# Patient Record
Sex: Male | Born: 1960 | Race: White | Hispanic: No | Marital: Married | Smoking: Never smoker
Health system: Southern US, Community
[De-identification: ages and names within clinical notes are randomized; demographics above are authoritative.]

## PROBLEM LIST (undated history)

## (undated) DIAGNOSIS — F329 Major depressive disorder, single episode, unspecified: Secondary | ICD-10-CM

## (undated) DIAGNOSIS — K625 Hemorrhage of anus and rectum: Secondary | ICD-10-CM

## (undated) DIAGNOSIS — E785 Hyperlipidemia, unspecified: Secondary | ICD-10-CM

## (undated) DIAGNOSIS — F32A Depression, unspecified: Secondary | ICD-10-CM

## (undated) DIAGNOSIS — I10 Essential (primary) hypertension: Secondary | ICD-10-CM

## (undated) HISTORY — DX: Depression, unspecified: F32.A

## (undated) HISTORY — DX: Essential (primary) hypertension: I10

## (undated) HISTORY — DX: Hemorrhage of anus and rectum: K62.5

## (undated) HISTORY — PX: ANAL FISSURE REPAIR: SHX2312

## (undated) HISTORY — PX: PROSTATE BIOPSY: SHX241

## (undated) HISTORY — DX: Hyperlipidemia, unspecified: E78.5

## (undated) HISTORY — DX: Major depressive disorder, single episode, unspecified: F32.9

---

## 2007-01-17 ENCOUNTER — Ambulatory Visit: Payer: Self-pay | Admitting: Internal Medicine

## 2007-01-17 LAB — CONVERTED CEMR LAB: TSH: 1.38 microintl units/mL (ref 0.35–5.50)

## 2007-02-14 ENCOUNTER — Ambulatory Visit: Payer: Self-pay | Admitting: Internal Medicine

## 2007-02-14 IMAGING — CR DG CHEST 2V
1 series · 1 of 1 positions shown · non-contrast
Comparison: None available.

CLINICAL DATA: Dry cough.  Physical.  
 CHEST - 2 VIEW - [DATE]:

[view not recorded]
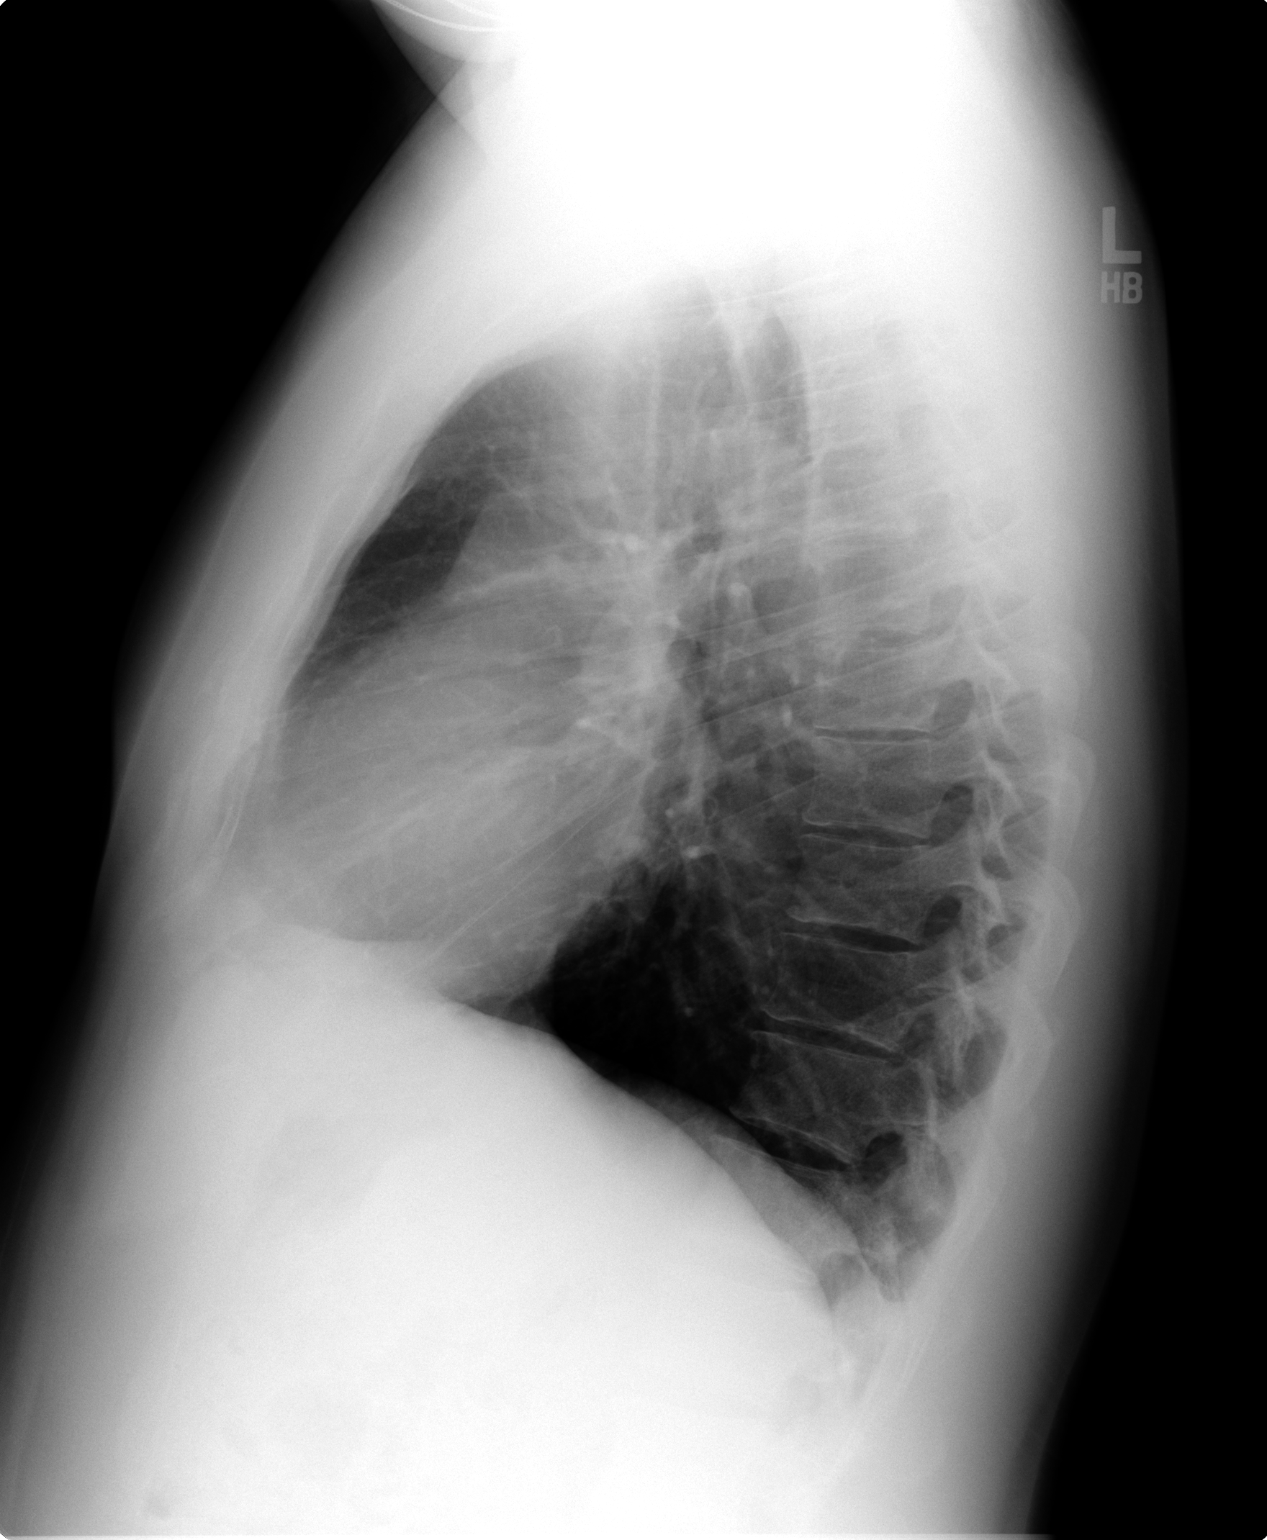

[1 of 1 positions shown; findings below may reference images not displayed]

FINDINGS: The trachea is midline.   The heart size is normal.   The lungs are clear.   No pleural fluid.
IMPRESSION: No acute findings.

## 2007-05-09 ENCOUNTER — Ambulatory Visit: Payer: Self-pay | Admitting: Internal Medicine

## 2007-05-09 LAB — CONVERTED CEMR LAB
ALT: 32 units/L (ref 0–40)
Alkaline Phosphatase: 71 units/L (ref 39–117)
Cholesterol: 183 mg/dL (ref 0–200)
Total Bilirubin: 0.6 mg/dL (ref 0.3–1.2)
Total CHOL/HDL Ratio: 3.8
Total Protein: 7.2 g/dL (ref 6.0–8.3)

## 2007-07-18 ENCOUNTER — Encounter: Payer: Self-pay | Admitting: Internal Medicine

## 2007-07-18 DIAGNOSIS — E785 Hyperlipidemia, unspecified: Secondary | ICD-10-CM

## 2007-07-18 DIAGNOSIS — D179 Benign lipomatous neoplasm, unspecified: Secondary | ICD-10-CM | POA: Insufficient documentation

## 2007-07-18 DIAGNOSIS — F329 Major depressive disorder, single episode, unspecified: Secondary | ICD-10-CM

## 2007-07-18 DIAGNOSIS — F32A Depression, unspecified: Secondary | ICD-10-CM | POA: Insufficient documentation

## 2007-11-19 ENCOUNTER — Encounter: Payer: Self-pay | Admitting: Internal Medicine

## 2008-02-13 ENCOUNTER — Encounter: Payer: Self-pay | Admitting: Internal Medicine

## 2008-05-11 ENCOUNTER — Encounter: Payer: Self-pay | Admitting: Internal Medicine

## 2008-08-04 ENCOUNTER — Ambulatory Visit: Payer: Self-pay | Admitting: Internal Medicine

## 2008-08-04 LAB — CONVERTED CEMR LAB
ALT: 24 units/L (ref 0–53)
AST: 23 units/L (ref 0–37)
Basophils Relative: 0.7 % (ref 0.0–3.0)
Bilirubin, Direct: 0.1 mg/dL (ref 0.0–0.3)
CO2: 29 meq/L (ref 19–32)
Calcium: 9.1 mg/dL (ref 8.4–10.5)
Glucose, Bld: 103 mg/dL — ABNORMAL HIGH (ref 70–99)
Hemoglobin: 14.8 g/dL (ref 13.0–17.0)
Leukocytes, UA: NEGATIVE
Lymphocytes Relative: 26.9 % (ref 12.0–46.0)
Monocytes Relative: 8.5 % (ref 3.0–12.0)
Neutro Abs: 4 10*3/uL (ref 1.4–7.7)
Nitrite: NEGATIVE
RBC: 4.76 M/uL (ref 4.22–5.81)
Sodium: 141 meq/L (ref 135–145)
Specific Gravity, Urine: 1.015 (ref 1.000–1.03)
Total CHOL/HDL Ratio: 2.9
Total Protein: 7.3 g/dL (ref 6.0–8.3)
pH: 7.5 (ref 5.0–8.0)

## 2008-08-09 ENCOUNTER — Ambulatory Visit: Payer: Self-pay | Admitting: Internal Medicine

## 2008-08-24 ENCOUNTER — Ambulatory Visit: Payer: Self-pay | Admitting: Internal Medicine

## 2008-08-24 DIAGNOSIS — J069 Acute upper respiratory infection, unspecified: Secondary | ICD-10-CM | POA: Insufficient documentation

## 2009-04-05 ENCOUNTER — Ambulatory Visit: Payer: Self-pay | Admitting: Internal Medicine

## 2009-04-05 DIAGNOSIS — M545 Low back pain: Secondary | ICD-10-CM | POA: Insufficient documentation

## 2009-04-05 DIAGNOSIS — R972 Elevated prostate specific antigen [PSA]: Secondary | ICD-10-CM | POA: Insufficient documentation

## 2009-04-05 LAB — CONVERTED CEMR LAB
Albumin: 3.9 g/dL (ref 3.5–5.2)
Basophils Absolute: 0 10*3/uL (ref 0.0–0.1)
CO2: 27 meq/L (ref 19–32)
Chloride: 107 meq/L (ref 96–112)
Eosinophils Absolute: 0.1 10*3/uL (ref 0.0–0.7)
GFR calc non Af Amer: 84.92 mL/min (ref >60)
Glucose, Bld: 98 mg/dL (ref 70–99)
HCT: 42.5 % (ref 39.0–52.0)
Lymphs Abs: 1.2 10*3/uL (ref 0.7–4.0)
MCHC: 34.6 g/dL (ref 30.0–36.0)
MCV: 89.1 fL (ref 78.0–100.0)
Monocytes Absolute: 0.4 10*3/uL (ref 0.1–1.0)
Monocytes Relative: 9.1 % (ref 3.0–12.0)
Platelets: 222 10*3/uL (ref 150.0–400.0)
Potassium: 4 meq/L (ref 3.5–5.1)
RDW: 12.9 % (ref 11.5–14.6)
Sed Rate: 7 mm/hr (ref 0–22)
Sodium: 140 meq/L (ref 135–145)
Total Bilirubin: 0.8 mg/dL (ref 0.3–1.2)

## 2009-04-07 LAB — CONVERTED CEMR LAB: Vit D, 25-Hydroxy: 34 ng/mL (ref 30–89)

## 2009-05-06 ENCOUNTER — Encounter: Payer: Self-pay | Admitting: Internal Medicine

## 2009-09-14 ENCOUNTER — Ambulatory Visit: Payer: Self-pay | Admitting: Internal Medicine

## 2009-09-14 DIAGNOSIS — R109 Unspecified abdominal pain: Secondary | ICD-10-CM

## 2009-11-07 ENCOUNTER — Encounter: Payer: Self-pay | Admitting: Internal Medicine

## 2010-06-30 ENCOUNTER — Encounter: Payer: Self-pay | Admitting: Internal Medicine

## 2010-08-24 ENCOUNTER — Ambulatory Visit: Payer: Self-pay | Admitting: Internal Medicine

## 2010-08-25 ENCOUNTER — Ambulatory Visit: Payer: Self-pay | Admitting: Internal Medicine

## 2010-08-30 LAB — CONVERTED CEMR LAB
ALT: 26 units/L (ref 0–53)
BUN: 19 mg/dL (ref 6–23)
Basophils Absolute: 0 10*3/uL (ref 0.0–0.1)
Bilirubin Urine: NEGATIVE
Calcium: 9.7 mg/dL (ref 8.4–10.5)
Chloride: 102 meq/L (ref 96–112)
Cholesterol: 229 mg/dL — ABNORMAL HIGH (ref 0–200)
Creatinine, Ser: 1.2 mg/dL (ref 0.4–1.5)
Eosinophils Absolute: 0.2 10*3/uL (ref 0.0–0.7)
GFR calc non Af Amer: 71.85 mL/min (ref >60)
HCT: 44.1 % (ref 39.0–52.0)
Hemoglobin: 15.4 g/dL (ref 13.0–17.0)
Ketones, ur: NEGATIVE mg/dL
Leukocytes, UA: NEGATIVE
Lymphocytes Relative: 27.1 % (ref 12.0–46.0)
Lymphs Abs: 1.6 10*3/uL (ref 0.7–4.0)
MCHC: 35 g/dL (ref 30.0–36.0)
Neutro Abs: 3.6 10*3/uL (ref 1.4–7.7)
RDW: 13.1 % (ref 11.5–14.6)
Total Bilirubin: 0.8 mg/dL (ref 0.3–1.2)
Total CHOL/HDL Ratio: 4
Triglycerides: 107 mg/dL (ref 0.0–149.0)
Urobilinogen, UA: 0.2 (ref 0.0–1.0)

## 2010-12-19 NOTE — Assessment & Plan Note (Signed)
Summary: per flag/Stacy needs ov/#/cd   Vital Signs:  Patient profile:   50 year old male Height:      64 inches Weight:      182 pounds BMI:     31.35 Temp:     97.3 degrees F oral Pulse rate:   80 / minute Pulse rhythm:   regular Resp:     16 per minute BP sitting:   140 / 90  (left arm) Cuff size:   regular  Vitals Entered By: Lanier Prude, CMA(AAMA) (August 24, 2010 11:06 AM) CC: f/u Is Patient Diabetic? No   CC:  f/u.  History of Present Illness: The patient presents for a follow up of  hyperlipidemia The patient presents for a preventive health examination    Current Medications (verified): 1)  Zoloft 100 Mg  Tabs (Sertraline Hcl) .... Once Daily 2)  Simvastatin 40 Mg  Tabs (Simvastatin) .... Once Daily 3)  Vitamin D3 1000 Unit  Tabs (Cholecalciferol) .[name].. 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         anal mucousy d/c x months, no blood or diarrhea  Physical Exam  General:  Well-developed,well-nourished,in no acute distress; alert,appropriate and cooperative throughout examination Head:  Normocephalic and atraumatic without obvious abnormalities. No apparent alopecia or  balding. Eyes:  No corneal or conjunctival inflammation noted. EOMI. Perrla. Ears:  External ear exam shows no significant lesions or deformities.  Otoscopic examination reveals clear canals, tympanic membranes are intact bilaterally without bulging, retraction, inflammation or discharge. Hearing is grossly normal bilaterally. Nose:  External nasal examination shows no deformity or inflammation. Nasal mucosa are pink and moist without lesions or exudates. Mouth:  Oral mucosa and oropharynx without lesions or exudates.  Teeth in good repair. Neck:  No deformities, masses, or tenderness noted. Lungs:  Normal respiratory effort, chest expands symmetrically. Lungs are clear to auscultation, no crackles or wheezes. Heart:  Normal rate and regular rhythm. S1 and S2 normal without gallop, murmur, click, rub or other extra sounds. Abdomen:  Bowel sounds positive,abdomen soft and non-tender without masses, organomegaly or hernias noted. Rectal:  NE Genitalia:  per Urol Prostate:  per Urol Msk:  No deformity or scoliosis noted of thoracic or lumbar spine.   Pulses:  R and L carotid,radial,femoral,dorsalis pedis and posterior tibial pulses are full and equal bilaterally Extremities:  No clubbing, cyanosis, edema, or deformity noted with normal full range of motion of all joints.   Neurologic:  No cranial nerve deficits noted. Station and gait are normal. Plantar reflexes are down-going bilaterally. DTRs are symmetrical throughout. Sensory, motor and coordinative functions appear intact. Skin:  Intact without suspicious lesions or rashes Cervical Nodes:  No lymphadenopathy noted Psych:  Cognition and judgment appear intact. Alert and cooperative with normal attention span and concentration. No apparent delusions, illusions, hallucinations   Impression & Recommendations:  Problem # 1:  WELL ADULT  EXAM (ICD-V70.0) Assessment New  Health and age related issues were discussed. Available screening tests and  vaccinations were discussed as well. Healthy life style including good diet and exercise was discussed.  Labs ordered  Problem # 2:  DEPRESSION (ICD-311) Assessment: Improved  His updated medication list for this problem includes:    Zoloft 100 Mg Tabs (Sertraline hcl) ..... Once daily  Problem # 3:  HYPERLIPIDEMIA (ICD-272.4) Assessment: Improved  His updated medication list for this problem includes:    Simvastatin 40 Mg Tabs (Simvastatin) ..... Once daily  Complete Medication List: 1)  Zoloft 100 Mg Tabs (Sertraline hcl) .... Once daily 2)  Simvastatin 40 Mg Tabs (Simvastatin) .... Once daily 3)  Vitamin D3 1000 Unit Tabs (Cholecalciferol) .[name].. 1 by mouth daily 4)  Anusol-hc 25 Mg Supp (Hydrocortisone acetate) .[name].. 1 pr two times a day 5)  Anusol-hc 2.5 % Crea (Hydrocortisone) .... Use two times a day prn  Patient Instructions: 1)  Labs tomorrow 2)  BMP prior to visit, ICD-9: 3)  Hepatic Panel prior to visit, ICD-9: v70.0 4)  Lipid Panel prior to visit, ICD-9: 5)  TSH prior to visit, ICD-9: 6)  CBC w/ Diff prior to visit, ICD-9: 7)  Urine-dip prior to visit, ICD-9: 272.0 995.20 8)  Please schedule a follow-up appointment in 1 year. 9)  Nl BP 130/85 or less Prescriptions: SIMVASTATIN 40 MG  TABS (SIMVASTATIN) once daily  #90 Tablet x 3   Entered and Authorized by:   Tresa Garter MD   Signed by:   Tresa Garter MD on 08/24/2010   Method used:   Print then Give to Patient   RxID:   [id number] ANUSOL-HC 2.5 % CREA (HYDROCORTISONE) use two times a day prn  #45 g x 3   Entered and Authorized by:   Tresa Garter MD   Signed by:   Tresa Garter MD on 08/24/2010   Method used:   Print then Give to Patient   RxID:   [id number] ANUSOL-HC 25 MG SUPP (HYDROCORTISONE ACETATE) 1 pr two times a day  #20 x 3   Entered and Authorized by:   Tresa Garter MD   Signed by:   Tresa Garter MD on 08/24/2010   Method used:   Print then Give to  Patient   RxID:   [id number]

## 2010-12-19 NOTE — Letter (Signed)
Summary: Office Visit / [facility]   Office Visit / [facility]   Imported By: Lennie Odor 11/21/2009 14:14:43  _____________________________________________________________________  External Attachment:    Type:   Image     Comment:   External Document

## 2010-12-19 NOTE — Letter (Signed)
Summary: [facility]   Imported By: Sherian Rein 07/25/2010 07:40:02  _____________________________________________________________________  External Attachment:    Type:   Image     Comment:   External Document

## 2011-01-02 ENCOUNTER — Encounter: Payer: Self-pay | Admitting: Internal Medicine

## 2011-01-02 ENCOUNTER — Ambulatory Visit (INDEPENDENT_AMBULATORY_CARE_PROVIDER_SITE_OTHER): Payer: BC Managed Care – PPO | Admitting: Internal Medicine

## 2011-01-02 DIAGNOSIS — I10 Essential (primary) hypertension: Secondary | ICD-10-CM

## 2011-01-02 DIAGNOSIS — R519 Headache, unspecified: Secondary | ICD-10-CM | POA: Insufficient documentation

## 2011-01-02 DIAGNOSIS — R51 Headache: Secondary | ICD-10-CM

## 2011-01-02 DIAGNOSIS — K649 Unspecified hemorrhoids: Secondary | ICD-10-CM

## 2011-01-10 NOTE — Assessment & Plan Note (Signed)
Summary: blood pressure   Vital Signs:  Patient profile:   50 year old male Height:      64 inches Weight:      183 pounds BMI:     31.53 Temp:     98.5 degrees F oral Pulse rate:   76 / minute Pulse rhythm:   regular Resp:     16 per minute BP sitting:   132 / 90  (left arm) Cuff size:   regular  Vitals Entered By: Lanier Prude, CMA(AAMA) (January 02, 2011 11:22 AM) CC: elevated BP, "Heavy head" Is Patient Diabetic? No   CC:  elevated BP and "Heavy head".  History of Present Illness: F/u HTN, HA and stress SBP 150s lately  Current Medications (verified): 1)  Zoloft 100 Mg  Tabs (Sertraline Hcl) .... Once Daily 2)  Simvastatin 40 Mg  Tabs (Simvastatin) .... Once Daily 3)  Vitamin D3 1000 Unit  Tabs (Cholecalciferol) .[name].. 1 By Mouth Daily 4)  Anusol-Hc 2.5 % Crea (Hydrocortisone) .... Use Two Times A Day Prn  Allergies (verified): No Known Drug Allergies  Past History:  Past Medical History: Depression Hyperlipidemia Elev PSA s/p prostate Bx 2009 Hypertension  Physical Exam  General:  Well-developed,well-nourished,in no acute distress; alert,appropriate and cooperative throughout examination   Impression & Recommendations:  Problem # 1:  HYPERTENSION (ICD-401.9)  His updated medication list for this problem includes:    Losartan Potassium 100 Mg Tabs (Losartan potassium) .[name]... 1 by mouth once daily for blood pressure  Problem # 2:  HEADACHE (ICD-784.0) due to #1 Assessment: New  Problem # 3:  HEMORRHOIDS (ICD-455.6)/discharge Assessment: Unchanged  GI consult/colon with Dr Loreta Ave in March.   Orders: Gastroenterology Referral (GI)  Complete Medication List: 1)  Zoloft 100 Mg Tabs (Sertraline hcl) .... Once daily 2)  Simvastatin 40 Mg Tabs (Simvastatin) .... Once daily 3)  Vitamin D3 1000 Unit Tabs (Cholecalciferol) .[name].. 1 by mouth daily 4)  Anusol-hc 2.5 % Crea (Hydrocortisone) .... Use two times a day prn 5)  Losartan Potassium 100 Mg Tabs  (Losartan potassium) .[name].. 1 by mouth once daily for blood pressure  Patient Instructions: 1)  Please schedule a follow-up appointment in 3 months. 2)  Normal BP < 130/85 Prescriptions: ANUSOL-HC 2.5 % CREA (HYDROCORTISONE) use two times a day prn  #45 g x 3   Entered and Authorized by:   Tresa Garter MD   Signed by:   Tresa Garter MD on 01/02/2011   Method used:   Print then Give to Patient   RxID:   [id number] LOSARTAN POTASSIUM 100 MG TABS (LOSARTAN POTASSIUM) 1 by mouth once daily for blood pressure  #30 x 12   Entered and Authorized by:   Tresa Garter MD   Signed by:   Tresa Garter MD on 01/02/2011   Method used:   Print then Give to Patient   RxID:   [id number]    Orders Added: 1)  Gastroenterology Referral [GI]  Appended Document: blood pressure Addendum: PE: HEENT nl; lungs CTA: heart RRR; LE w/o edema. Rectal: NE. Plan: reduce stress, regular exercise, NAS diet.

## 2011-04-06 NOTE — Assessment & Plan Note (Signed)
[facility]                           PRIMARY CARE OFFICE NOTE   NAME:Orozco, Kenneth                           MRN:          [id number]  DATE:01/17/2007                            DOB:          1961/07/29    The patient is a 50 year old male who presents to establish primary.  He  complains of occasional low back pain over the past several years.  This  happens on rare occasions, last time last month.  The pain lasts for 3-4  days, goes down to the buttock and makes him quite incapacitated for a  few days.  He is also concerned about elevated cholesterol, depressive  symptoms, and weight gain over the past several months.   PAST MEDICAL HISTORY:  1. Dyslipidemia.  2. Depression monitored by Dr. Nolen Mu.  3. 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 Retta Diones.   ALLERGIES:  None.   MEDICATIONS:  1. Zoloft 20 mg daily.  2. Simvastatin 40 mg daily.   FAMILY HISTORY:  Father with prostate cancer at age 34.  Mother with  elevated cholesterol.  Grandfather with lung cancer.   SOCIAL HISTORY:  He works for [employer].  He is married with 2 kids.  Nonsmoker.   REVIEW OF SYSTEMS:  No chest pain or shortness of breath.  Doing well.  The rest of 18-point Review of Systems is negative.   PHYSICAL EXAMINATION:  VITAL SIGNS:  Blood pressure 129/82, pulse 64,  temperature 98.7.  GENERAL:  Looks well.  HEENT:  Moist mucosa.  NECK: Supple.  Thyroid is not enlarged.  LUNGS:  Clear, no wheezes.  CARDIAC: S1, S2.  No murmurs, rubs, or gallops.  ABDOMEN: Soft, nontender without organomegaly or mass felt.  EXTREMITIES:  Lower extremities without edema.  NEUROLOGIC:  Alert and oriented, appropriate.  Denies being depressed.  SKIN:  Clear. Groin not examined.  MUSCULOSKELETAL:  LS without deformities.  Fairly good range of motion.   LABORATORY DATA:  None available.   ASSESSMENT AND PLAN:  1. Weight gain, likely dietary related.  Reduce portions. Obtain TSH       level.  2. Dyslipidemia.  He is on Lipitor.  Fasting lipid profile was done      fairly recently by Dr. Arlyce Dice at [facility].  3. Depressive symptoms.  He has been seeing Dr. Nolen Mu.  He will      continue with Zoloft.  4. Low back pain, occasional, likely musculoskeletal strain.  No signs      of radiculopathy.  If recurs, he may consider seeing Dr. Artist Pais.      Obtain sed rate, obtain LS spine x-rays.  5. Lipoma, left groin.  This is being monitored by Dr. Retta Diones.     Georgina Quint. Plotnikov, MD  Electronically Signed    AVP/MedQ  DD: 01/20/2007  DT: 01/20/2007  Job #: [id number]   cc:   Bertram Millard. Dahlstedt, M.D.

## 2011-07-30 ENCOUNTER — Encounter (INDEPENDENT_AMBULATORY_CARE_PROVIDER_SITE_OTHER): Payer: Self-pay | Admitting: General Surgery

## 2011-07-30 ENCOUNTER — Ambulatory Visit (INDEPENDENT_AMBULATORY_CARE_PROVIDER_SITE_OTHER): Payer: BC Managed Care – PPO | Admitting: General Surgery

## 2011-07-30 VITALS — BP 118/86 | HR 62 | Temp 97.9°F | Ht 65.0 in | Wt 180.0 lb

## 2011-07-30 DIAGNOSIS — L290 Pruritus ani: Secondary | ICD-10-CM

## 2011-07-30 NOTE — Patient Instructions (Signed)
You may have pruritus ani.  Use very soft toilet paper [employer] or [employer] brand).  Apply Desitin to area twice a day.

## 2011-07-30 NOTE — Progress Notes (Signed)
Chief Complaint  Patient presents with  . Other    new pt- eval of rectal discharge     HPI Kenneth Orozco is a 50 y.o. male.   HPI  He is referred by Dr. Loreta Ave for anal discharge and itching.  This has been occurring for about 5 years.  It causes severe pruritus as well.  It does not appear to be related to any specific food or drink.  He gets relief with steroid suppositories.  One suppository can give him relief of his symptoms for 3 days.  It is associated with intermittent rectal bleeding as well.  He underwent a colonoscopy on 06/29/11 which demonstrated small internal hemorrhoids and scattered diverticulosis.  He is here for further evaluation and treatment.  He does cleanse himself vigorously after BMs with dry toilet paper.  Past Medical History  Diagnosis Date  . Hyperlipidemia   . Rectal bleeding   . Hypertension   . Depression     Past Surgical History  Procedure Date  . Anal fissure repair     History reviewed. No pertinent family history.  Social History History  Substance Use Topics  . Smoking status: Never Smoker   . Smokeless tobacco: Not on file  . Alcohol Use: No    No Known Allergies  Current Outpatient Prescriptions  Medication Sig Dispense Refill  . Cholecalciferol (VITAMIN D PO) Take by mouth daily.        . sertraline (ZOLOFT) 100 MG tablet Daily.      . simvastatin (ZOCOR) 40 MG tablet Daily.        Review of Systems Review of Systems  Constitutional: Negative.   Respiratory: Negative.   Cardiovascular: Negative.   Gastrointestinal: Negative for abdominal pain and abdominal distention.    Blood pressure 118/86, pulse 62, temperature 97.9 F (36.6 C), height 5\' 5"  (1.651 m), weight 180 lb (81.647 kg).  Physical Exam Physical Exam  Constitutional: He appears well-developed and well-nourished. No distress.  Genitourinary:       Perianal excoriation with dry red rash and intermittent white plaques extending posteriorly to the top of the gluteal  cleft.  No fissures.  No anal masses.  Anoscopy- small right internal hemorrhoids. No other lesions.  Skin: Rash noted.       White scaly rash on left elbow.    Data Reviewed Dr. Kenna Gilbert notes.  Assessment    Pruritus ani with significant perianal rash.  Etiology could be due to overvigorous anal cleansing or possibly psoriasis.    Plan    Use soft tissue paper and warm water for cleansing.  Apply Desitin to area.  Return visit in 4 weeks.         Zula Hovsepian J 07/30/2011, 9:37 PM

## 2011-08-14 ENCOUNTER — Other Ambulatory Visit: Payer: Self-pay | Admitting: *Deleted

## 2011-08-14 MED ORDER — SIMVASTATIN 40 MG PO TABS: 40.0000 mg | ORAL_TABLET | Freq: Every day | ORAL | Status: DC

## 2011-08-14 NOTE — Progress Notes (Signed)
Pt advised he needs OV. Transferred to scheduler to make f/u.

## 2011-08-21 ENCOUNTER — Telehealth: Payer: Self-pay | Admitting: *Deleted

## 2011-08-21 DIAGNOSIS — E785 Hyperlipidemia, unspecified: Secondary | ICD-10-CM

## 2011-08-21 DIAGNOSIS — I10 Essential (primary) hypertension: Secondary | ICD-10-CM

## 2011-08-21 NOTE — Telephone Encounter (Signed)
Message copied by Merrilyn Puma on Tue Aug 21, 2011  3:20 PM ------      Message from: Newell Coral      Created: Tue Aug 14, 2011  4:42 PM      Regarding: pt wanting labs       This pt is scheduled for a follow up in November.  He stated he wants to get labs done for this follow up appt.  He was hoping to get these done early morning around the time of his appt (2:45)             Thanks so much!!

## 2011-08-21 NOTE — Telephone Encounter (Signed)
What labs are needed for upcoming appt?

## 2011-08-21 NOTE — Telephone Encounter (Signed)
If it is a well exam: TSH, CBC, CMET, UA, lipids Thx

## 2011-08-22 NOTE — Telephone Encounter (Signed)
Not a CPE. Last TSH was normal. Ok for CBC, CMET, lipids?

## 2011-08-24 NOTE — Telephone Encounter (Signed)
Labs entered.

## 2011-08-24 NOTE — Telephone Encounter (Signed)
Addended by: Vernie Murders on: 08/24/2011 07:25 PM   Modules accepted: Orders

## 2011-08-24 NOTE — Telephone Encounter (Signed)
OK thx  

## 2011-08-27 ENCOUNTER — Other Ambulatory Visit: Payer: Self-pay | Admitting: Internal Medicine

## 2011-10-15 ENCOUNTER — Encounter: Payer: Self-pay | Admitting: Internal Medicine

## 2011-10-15 ENCOUNTER — Ambulatory Visit (INDEPENDENT_AMBULATORY_CARE_PROVIDER_SITE_OTHER): Payer: BC Managed Care – PPO | Admitting: Internal Medicine

## 2011-10-15 ENCOUNTER — Ambulatory Visit (INDEPENDENT_AMBULATORY_CARE_PROVIDER_SITE_OTHER)
Admission: RE | Admit: 2011-10-15 | Discharge: 2011-10-15 | Disposition: A | Discharge status: Home / Self Care | Payer: BC Managed Care – PPO | Source: Ambulatory Visit | Attending: Internal Medicine | Admitting: Internal Medicine

## 2011-10-15 DIAGNOSIS — M545 Low back pain, unspecified: Secondary | ICD-10-CM

## 2011-10-15 DIAGNOSIS — F3289 Other specified depressive episodes: Secondary | ICD-10-CM

## 2011-10-15 DIAGNOSIS — G8929 Other chronic pain: Secondary | ICD-10-CM

## 2011-10-15 DIAGNOSIS — I10 Essential (primary) hypertension: Secondary | ICD-10-CM

## 2011-10-15 DIAGNOSIS — F329 Major depressive disorder, single episode, unspecified: Secondary | ICD-10-CM

## 2011-10-15 IMAGING — CR DG LUMBAR SPINE 2-3V
3 series · 3 of 3 positions shown · non-contrast
Comparison: None.

CLINICAL DATA: Low back pain, recently lifted heavy couch

LUMBAR SPINE - 2-3 VIEW

[view not recorded (1 of 3)]
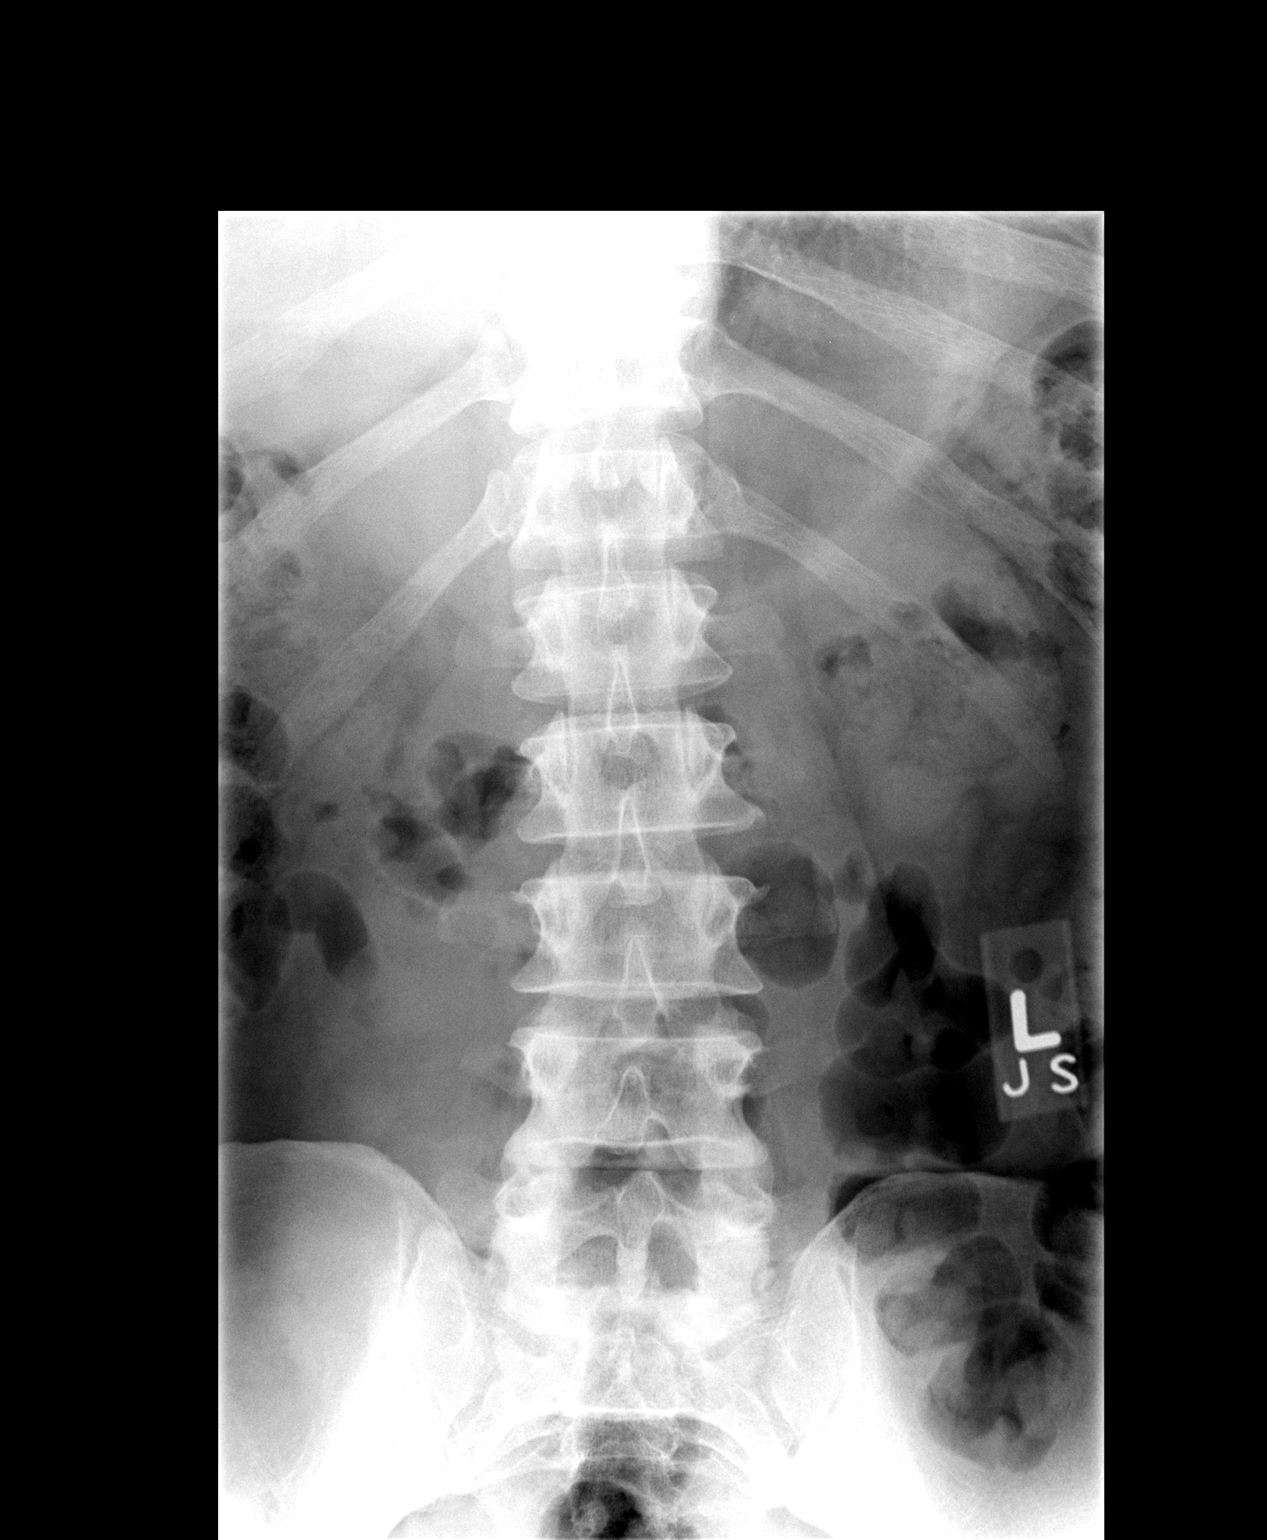

[view not recorded (2 of 3)]
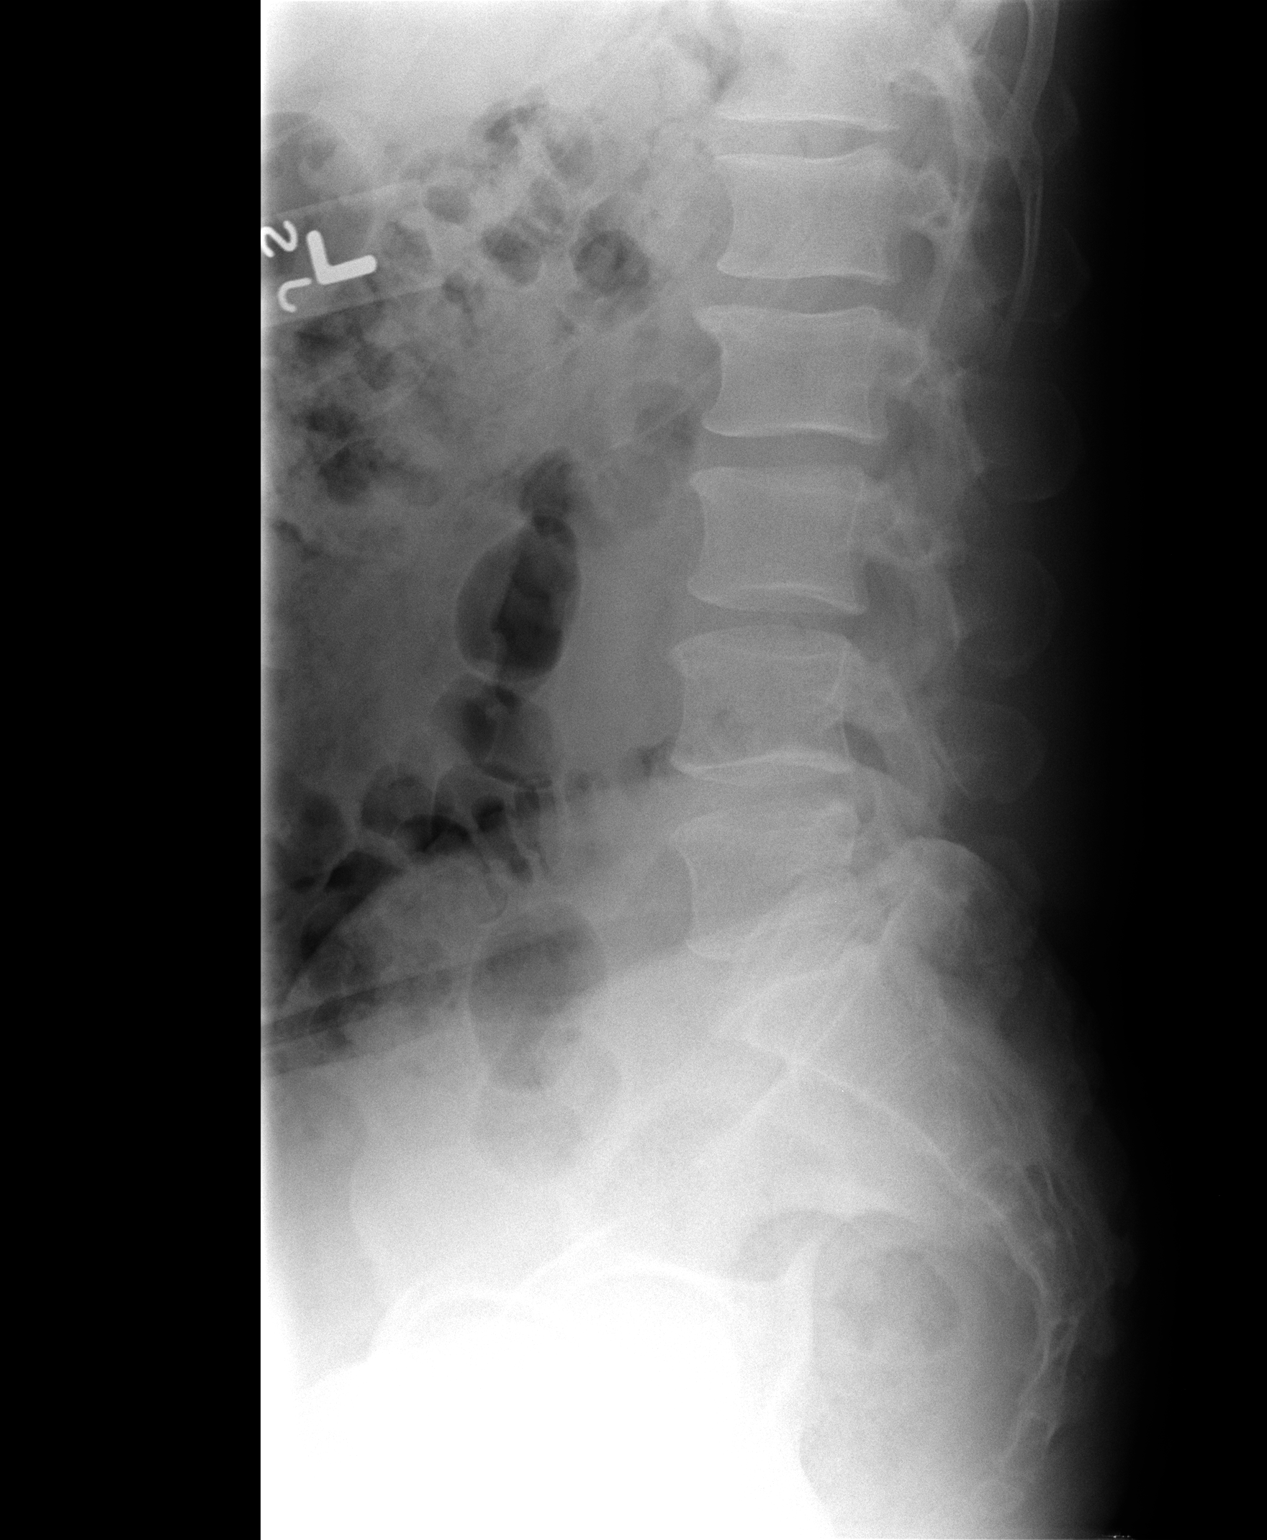

[view not recorded (3 of 3)]
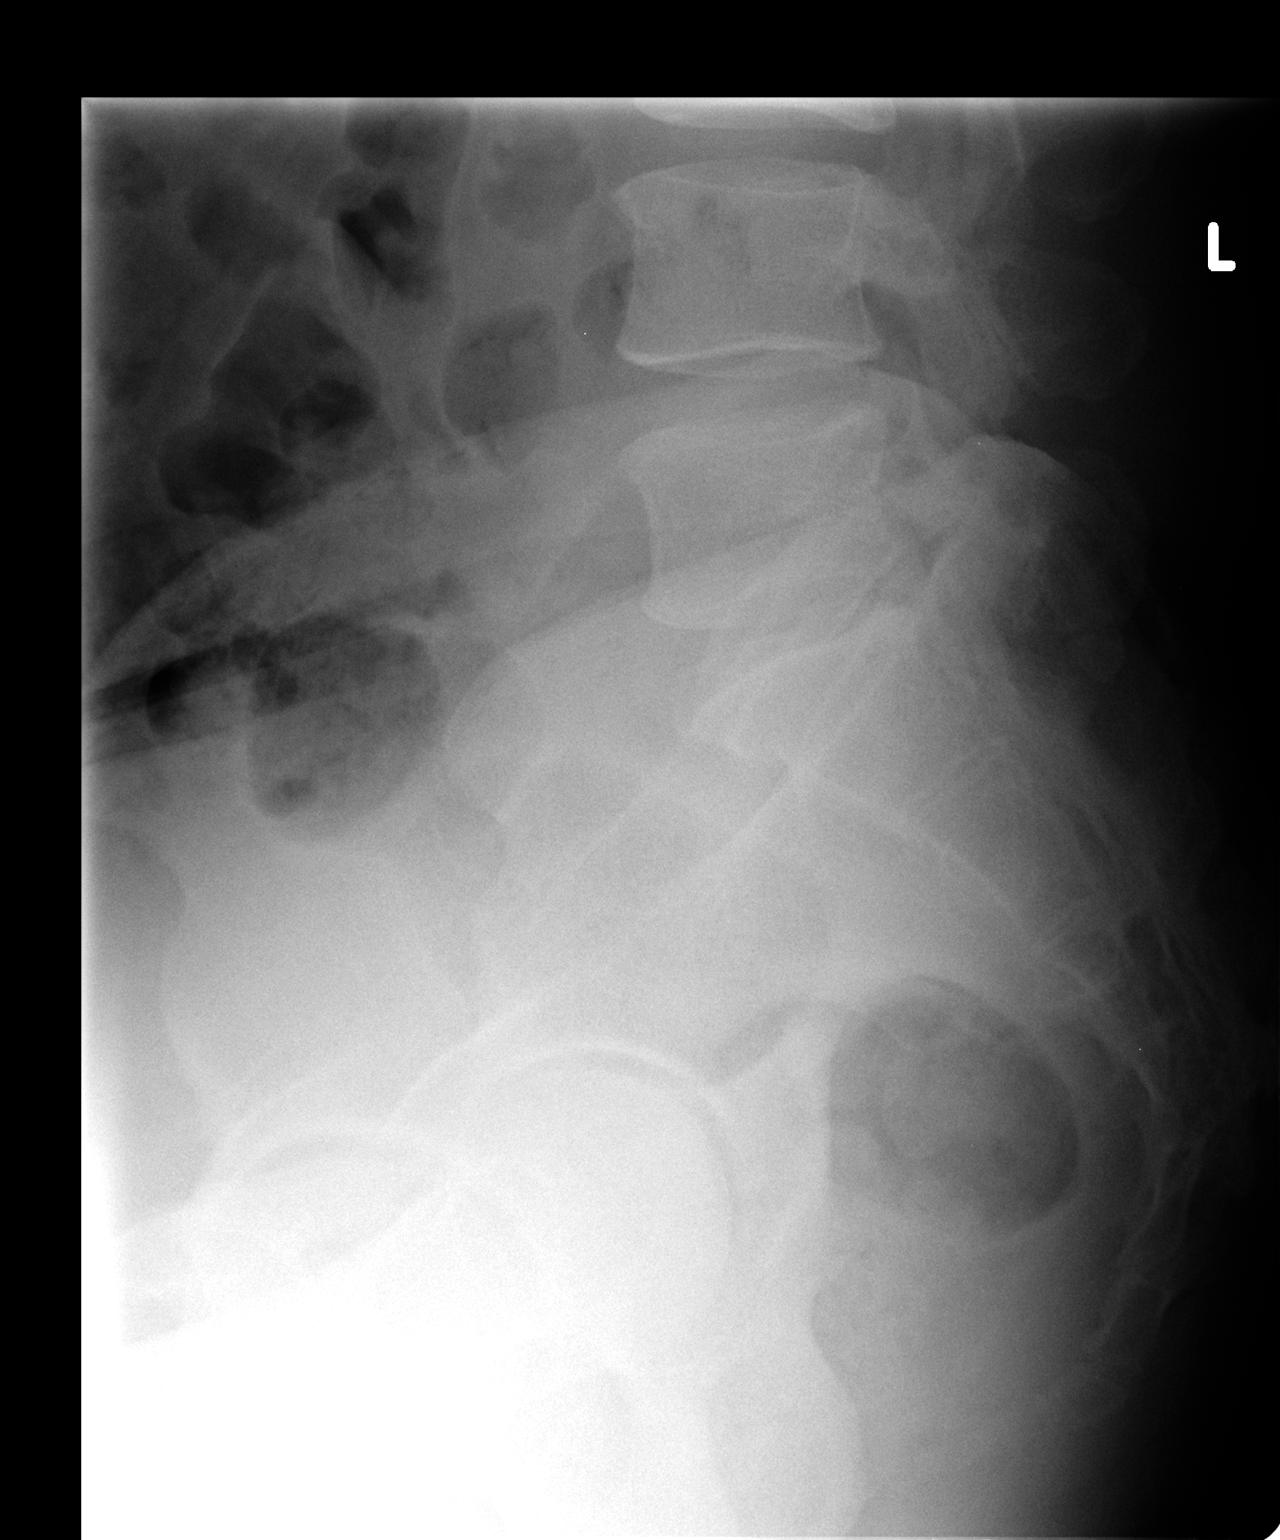

[3 of 3 positions shown; findings below may reference images not displayed]

FINDINGS: The lumbar vertebrae are straightened in alignment.
Intervertebral disc spaces appear normal.  No compression deformity
is seen.  The SI joints appear normal.
IMPRESSION: Straightened alignment.  Normal intervertebral disc spaces.

## 2011-10-15 MED ORDER — SIMVASTATIN 40 MG PO TABS: 40.0000 mg | ORAL_TABLET | Freq: Every day | ORAL | Status: DC

## 2011-10-15 MED ORDER — VALSARTAN 160 MG PO TABS: 160.0000 mg | ORAL_TABLET | Freq: Every day | ORAL | Status: DC

## 2011-10-15 NOTE — Assessment & Plan Note (Signed)
Continue with current prescription therapy as reflected on the Med list.  

## 2011-10-15 NOTE — Patient Instructions (Signed)
BP Readings from Last 3 Encounters:  10/15/11 150/90  07/30/11 118/86  01/02/11 132/90   Wt Readings from Last 3 Encounters:  10/15/11 183 lb (83.008 kg)  07/30/11 180 lb (81.647 kg)  01/02/11 183 lb (83.008 kg)   Normal BP<130/85

## 2011-10-15 NOTE — Assessment & Plan Note (Signed)
MSK  

## 2011-10-15 NOTE — Assessment & Plan Note (Signed)
Diovan if BP is up

## 2011-10-16 ENCOUNTER — Telehealth: Payer: Self-pay | Admitting: Internal Medicine

## 2011-10-16 NOTE — Telephone Encounter (Signed)
Pt informed/ copies mailed.  

## 2011-10-16 NOTE — Telephone Encounter (Signed)
Kenneth Orozco, please, inform patient that LS xray findings are consistent w/muscle spasm/strain  Please, mail the labs to the patient.    Thx

## 2011-10-17 ENCOUNTER — Encounter: Payer: Self-pay | Admitting: Internal Medicine

## 2011-10-17 NOTE — Progress Notes (Signed)
  Subjective:    [name], male    DOB: 10/12/1961, 50 y.o.   MRN: [id number]  HPI  The patient presents for a follow-up of  chronic depression, chronic dyslipidemia,controlled with medicines. C/o LBP that started a couple wks ago after he lifted a couch    Review of Systems  Constitutional: Negative for appetite change, fatigue and unexpected weight change.  HENT: Negative for nosebleeds, congestion, sore throat, sneezing, trouble swallowing and neck pain.   Eyes: Negative for itching and visual disturbance.  Respiratory: Negative for cough.   Cardiovascular: Negative for chest pain, palpitations and leg swelling.  Gastrointestinal: Negative for nausea, diarrhea, blood in stool and abdominal distention.  Genitourinary: Negative for frequency and hematuria.  Musculoskeletal: Positive for back pain. Negative for joint swelling and gait problem.  Skin: Negative for rash.  Neurological: Negative for dizziness, tremors, speech difficulty and weakness.  Psychiatric/Behavioral: Positive for sleep disturbance. Negative for suicidal ideas, dysphoric mood and agitation. The patient is not nervous/anxious.        Objective:   Physical Exam  Constitutional: He is oriented to person, place, and time. He appears well-developed.  HENT:  Mouth/Throat: Oropharynx is clear and moist.  Eyes: Conjunctivae are normal. Pupils are equal, round, and reactive to light.  Neck: Normal range of motion. No JVD present. No thyromegaly present.  Cardiovascular: Normal rate, regular rhythm, normal heart sounds and intact distal pulses.  Exam reveals no gallop and no friction rub.   No murmur heard. Pulmonary/Chest: Effort normal and breath sounds normal. No respiratory distress. He has no wheezes. He has no rales. He exhibits no tenderness.  Abdominal: Soft. Bowel sounds are normal. He exhibits no distension and no mass. There is no tenderness. There is no rebound and no guarding.  Musculoskeletal:  Normal range of motion. He exhibits no edema and no tenderness.       LS NT  Lymphadenopathy:    He has no cervical adenopathy.  Neurological: He is alert and oriented to person, place, and time. He has normal reflexes. No cranial nerve deficit. He exhibits normal muscle tone. Coordination normal.  Skin: Skin is warm and dry. No rash noted.  Psychiatric: He has a normal mood and affect. His behavior is normal. Judgment and thought content normal.          Assessment & Plan:

## 2011-11-09 ENCOUNTER — Other Ambulatory Visit (INDEPENDENT_AMBULATORY_CARE_PROVIDER_SITE_OTHER): Payer: BC Managed Care – PPO

## 2011-11-09 DIAGNOSIS — I10 Essential (primary) hypertension: Secondary | ICD-10-CM

## 2011-11-09 DIAGNOSIS — E785 Hyperlipidemia, unspecified: Secondary | ICD-10-CM

## 2011-11-09 LAB — LIPID PANEL
Cholesterol: 198 mg/dL (ref 0–200)
LDL Cholesterol: 108 mg/dL — ABNORMAL HIGH (ref 0–99)
Total CHOL/HDL Ratio: 4
Triglycerides: 176 mg/dL — ABNORMAL HIGH (ref 0.0–149.0)

## 2011-11-09 LAB — CBC WITH DIFFERENTIAL/PLATELET
Basophils Relative: 0.4 % (ref 0.0–3.0)
Eosinophils Relative: 3.4 % (ref 0.0–5.0)
Hemoglobin: 15.4 g/dL (ref 13.0–17.0)
Lymphocytes Relative: 28 % (ref 12.0–46.0)
Monocytes Relative: 10.4 % (ref 3.0–12.0)
Neutro Abs: 3.5 10*3/uL (ref 1.4–7.7)
Neutrophils Relative %: 57.8 % (ref 43.0–77.0)
RBC: 5.04 Mil/uL (ref 4.22–5.81)
WBC: 6.1 10*3/uL (ref 4.5–10.5)

## 2011-11-09 LAB — HEPATIC FUNCTION PANEL
ALT: 21 U/L (ref 0–53)
AST: 20 U/L (ref 0–37)
Bilirubin, Direct: 0.1 mg/dL (ref 0.0–0.3)
Total Bilirubin: 0.6 mg/dL (ref 0.3–1.2)
Total Protein: 7.1 g/dL (ref 6.0–8.3)

## 2011-11-09 LAB — BASIC METABOLIC PANEL
BUN: 16 mg/dL (ref 6–23)
CO2: 30 mEq/L (ref 19–32)
Chloride: 104 mEq/L (ref 96–112)
Creatinine, Ser: 1.1 mg/dL (ref 0.4–1.5)
Potassium: 4 mEq/L (ref 3.5–5.1)

## 2012-04-15 ENCOUNTER — Encounter: Payer: BC Managed Care – PPO | Admitting: Internal Medicine

## 2012-05-02 ENCOUNTER — Encounter: Payer: Self-pay | Admitting: Internal Medicine

## 2012-05-02 ENCOUNTER — Ambulatory Visit (INDEPENDENT_AMBULATORY_CARE_PROVIDER_SITE_OTHER): Payer: BC Managed Care – PPO | Admitting: Internal Medicine

## 2012-05-02 VITALS — BP 136/94 | HR 80 | Temp 98.4°F | Resp 16 | Ht 65.0 in | Wt 182.0 lb

## 2012-05-02 DIAGNOSIS — Z0000 Encounter for general adult medical examination without abnormal findings: Secondary | ICD-10-CM

## 2012-05-02 DIAGNOSIS — I10 Essential (primary) hypertension: Secondary | ICD-10-CM

## 2012-05-02 DIAGNOSIS — F329 Major depressive disorder, single episode, unspecified: Secondary | ICD-10-CM

## 2012-05-02 DIAGNOSIS — Z23 Encounter for immunization: Secondary | ICD-10-CM

## 2012-05-02 DIAGNOSIS — F3289 Other specified depressive episodes: Secondary | ICD-10-CM

## 2012-05-02 DIAGNOSIS — E785 Hyperlipidemia, unspecified: Secondary | ICD-10-CM

## 2012-05-02 MED ORDER — SERTRALINE HCL 100 MG PO TABS: 150.0000 mg | ORAL_TABLET | Freq: Every day | ORAL | Status: DC

## 2012-05-02 MED ORDER — SIMVASTATIN 40 MG PO TABS: 40.0000 mg | ORAL_TABLET | Freq: Every day | ORAL | Status: DC

## 2012-05-02 MED ORDER — VALSARTAN 160 MG PO TABS: 160.0000 mg | ORAL_TABLET | Freq: Every day | ORAL | Status: DC

## 2012-05-02 NOTE — Patient Instructions (Signed)
Wt Readings from Last 3 Encounters:  05/02/12 182 lb (82.555 kg)  10/15/11 183 lb (83.008 kg)  07/30/11 180 lb (81.647 kg)   BP Readings from Last 3 Encounters:  05/02/12 136/94  10/15/11 150/90  07/30/11 118/86    Normal BP<130/85

## 2012-05-02 NOTE — Assessment & Plan Note (Signed)
Start Valsartan Labs

## 2012-05-02 NOTE — Assessment & Plan Note (Signed)
Labs  Continue with current prescription therapy as reflected on the Med list.  

## 2012-05-02 NOTE — Progress Notes (Signed)
[name], male   DOB: 1961-02-13, 51 y.o.   MRN: [id number]  Subjective:    [name], male    DOB: 10-20-1961, 51 y.o.   MRN: [id number]  HPI The patient is here for a wellness exam. The patient has been doing well overall without major physical or psychological issues going on lately. The patient presents for a follow-up of  chronic depression, chronic dyslipidemia,controlled with medicines. BP Readings from Last 3 Encounters:  05/02/12 136/94  10/15/11 150/90  07/30/11 118/86   Wt Readings from Last 3 Encounters:  05/02/12 182 lb (82.555 kg)  10/15/11 183 lb (83.008 kg)  07/30/11 180 lb (81.647 kg)      Review of Systems  Constitutional: Negative for appetite change, fatigue and unexpected weight change.  HENT: Negative for nosebleeds, congestion, sore throat, sneezing, trouble swallowing and neck pain.   Eyes: Negative for itching and visual disturbance.  Respiratory: Negative for cough.   Cardiovascular: Negative for chest pain, palpitations and leg swelling.  Gastrointestinal: Negative for nausea, diarrhea, blood in stool and abdominal distention.  Genitourinary: Negative for frequency and hematuria.  Musculoskeletal: Positive for back pain. Negative for joint swelling and gait problem.  Skin: Negative for rash.  Neurological: Negative for dizziness, tremors, speech difficulty and weakness.  Psychiatric/Behavioral: Positive for disturbed wake/sleep cycle. Negative for suicidal ideas, dysphoric mood and agitation. The patient is not nervous/anxious.        Objective:   Physical Exam  Constitutional: He is oriented to person, place, and time. He appears well-developed.  HENT:  Mouth/Throat: Oropharynx is clear and moist.  Eyes: Conjunctivae are normal. Pupils are equal, round, and reactive to light.  Neck: Normal range of motion. No JVD present. No thyromegaly present.  Cardiovascular: Normal rate, regular rhythm, normal heart sounds and intact  distal pulses.  Exam reveals no gallop and no friction rub.   No murmur heard. Pulmonary/Chest: Effort normal and breath sounds normal. No respiratory distress. He has no wheezes. He has no rales. He exhibits no tenderness.  Abdominal: Soft. Bowel sounds are normal. He exhibits no distension and no mass. There is no tenderness. There is no rebound and no guarding.  Genitourinary:       Per GI/Urol - recent  Musculoskeletal: Normal range of motion. He exhibits no edema and no tenderness.       LS NT  Lymphadenopathy:    He has no cervical adenopathy.  Neurological: He is alert and oriented to person, place, and time. He has normal reflexes. No cranial nerve deficit. He exhibits normal muscle tone. Coordination normal.  Skin: Skin is warm and dry. No rash noted.  Psychiatric: He has a normal mood and affect. His behavior is normal. Judgment and thought content normal.   Lab Results  Component Value Date   WBC 6.1 11/09/2011   HGB 15.4 11/09/2011   HCT 44.7 11/09/2011   PLT 217.0 11/09/2011   GLUCOSE 89 11/09/2011   CHOL 198 11/09/2011   TRIG 176.0* 11/09/2011   HDL 54.80 11/09/2011   LDLDIRECT 148.1 08/25/2010   LDLCALC 108* 11/09/2011   ALT 21 11/09/2011   AST 20 11/09/2011   NA 141 11/09/2011   K 4.0 11/09/2011   CL 104 11/09/2011   CREATININE 1.1 11/09/2011   BUN 16 11/09/2011   CO2 30 11/09/2011   TSH 1.18 08/25/2010   PSA 2.61 08/25/2010          Assessment & Plan:

## 2012-05-07 DIAGNOSIS — Z0000 Encounter for general adult medical examination without abnormal findings: Secondary | ICD-10-CM | POA: Insufficient documentation

## 2012-05-07 NOTE — Assessment & Plan Note (Signed)
Continue with current prescription therapy as reflected on the Med list.  

## 2012-05-07 NOTE — Assessment & Plan Note (Signed)
We discussed age appropriate health related issues, including available/recomended screening tests and vaccinations. We discussed a need for adhering to healthy diet and exercise. Labs were reviewed/ordered. All questions were answered.   

## 2012-08-01 ENCOUNTER — Other Ambulatory Visit (INDEPENDENT_AMBULATORY_CARE_PROVIDER_SITE_OTHER): Payer: BC Managed Care – PPO

## 2012-08-01 ENCOUNTER — Telehealth: Payer: Self-pay | Admitting: Internal Medicine

## 2012-08-01 DIAGNOSIS — E785 Hyperlipidemia, unspecified: Secondary | ICD-10-CM

## 2012-08-01 DIAGNOSIS — I10 Essential (primary) hypertension: Secondary | ICD-10-CM

## 2012-08-01 LAB — HEPATIC FUNCTION PANEL
AST: 24 U/L (ref 0–37)
Alkaline Phosphatase: 63 U/L (ref 39–117)
Bilirubin, Direct: 0 mg/dL (ref 0.0–0.3)
Total Bilirubin: 0.4 mg/dL (ref 0.3–1.2)

## 2012-08-01 LAB — BASIC METABOLIC PANEL
BUN: 20 mg/dL (ref 6–23)
CO2: 29 mEq/L (ref 19–32)
Chloride: 102 mEq/L (ref 96–112)
Creatinine, Ser: 1.1 mg/dL (ref 0.4–1.5)
Glucose, Bld: 98 mg/dL (ref 70–99)

## 2012-08-01 LAB — LIPID PANEL
Cholesterol: 221 mg/dL — ABNORMAL HIGH (ref 0–200)
Total CHOL/HDL Ratio: 4
VLDL: 19.2 mg/dL (ref 0.0–40.0)

## 2012-08-01 NOTE — Telephone Encounter (Signed)
Kenneth Orozco, please, inform patient that all labs are normal except for a little elev chol of 221  Please, mail the labs to the patient.    Thx

## 2012-08-04 NOTE — Telephone Encounter (Signed)
Pt informed/ copies mailed.  

## 2012-10-07 ENCOUNTER — Ambulatory Visit (INDEPENDENT_AMBULATORY_CARE_PROVIDER_SITE_OTHER): Payer: BC Managed Care – PPO | Admitting: Internal Medicine

## 2012-10-07 ENCOUNTER — Encounter: Payer: Self-pay | Admitting: Internal Medicine

## 2012-10-07 VITALS — BP 160/100 | HR 72 | Temp 98.5°F | Wt 180.0 lb

## 2012-10-07 DIAGNOSIS — Z2911 Encounter for prophylactic immunotherapy for respiratory syncytial virus (RSV): Secondary | ICD-10-CM

## 2012-10-07 DIAGNOSIS — Z23 Encounter for immunization: Secondary | ICD-10-CM

## 2012-10-07 DIAGNOSIS — E785 Hyperlipidemia, unspecified: Secondary | ICD-10-CM

## 2012-10-07 DIAGNOSIS — R972 Elevated prostate specific antigen [PSA]: Secondary | ICD-10-CM

## 2012-10-07 MED ORDER — EZETIMIBE-SIMVASTATIN 10-40 MG PO TABS: 1.0000 | ORAL_TABLET | Freq: Every day | ORAL | Status: DC

## 2012-10-07 NOTE — Assessment & Plan Note (Signed)
Per Dr Retta Diones q 6 mo

## 2012-10-07 NOTE — Addendum Note (Signed)
Addended by: Merrilyn Puma on: 10/07/2012 04:11 PM   Modules accepted: Orders

## 2012-10-07 NOTE — Assessment & Plan Note (Signed)
Continue with current prescription therapy as reflected on the Med list.  

## 2012-10-07 NOTE — Assessment & Plan Note (Signed)
Start Diovan

## 2012-10-07 NOTE — Progress Notes (Signed)
   Subjective:    [name], male    DOB: June 26, 1961, 51 y.o.   MRN: [id number]  HPI The patient is here for a wellness exam. The patient has been doing well overall without major physical or psychological issues going on lately. The patient presents for a follow-up of  chronic depression, chronic dyslipidemia,controlled with medicines. BP Readings from Last 3 Encounters:  10/07/12 160/100  05/02/12 136/94  10/15/11 150/90   Wt Readings from Last 3 Encounters:  10/07/12 180 lb (81.647 kg)  05/02/12 182 lb (82.555 kg)  10/15/11 183 lb (83.008 kg)      Review of Systems  Constitutional: Negative for appetite change, fatigue and unexpected weight change.  HENT: Negative for nosebleeds, congestion, sore throat, sneezing, trouble swallowing and neck pain.   Eyes: Negative for itching and visual disturbance.  Respiratory: Negative for cough.   Cardiovascular: Negative for chest pain, palpitations and leg swelling.  Gastrointestinal: Negative for nausea, diarrhea, blood in stool and abdominal distention.  Genitourinary: Negative for frequency and hematuria.  Musculoskeletal: Positive for back pain. Negative for joint swelling and gait problem.  Skin: Negative for rash.  Neurological: Negative for dizziness, tremors, speech difficulty and weakness.  Psychiatric/Behavioral: Positive for sleep disturbance. Negative for suicidal ideas, dysphoric mood and agitation. The patient is not nervous/anxious.        Objective:   Physical Exam  Constitutional: He is oriented to person, place, and time. He appears well-developed.  HENT:  Mouth/Throat: Oropharynx is clear and moist.  Eyes: Conjunctivae normal are normal. Pupils are equal, round, and reactive to light.  Neck: Normal range of motion. No JVD present. No thyromegaly present.  Cardiovascular: Normal rate, regular rhythm, normal heart sounds and intact distal pulses.  Exam reveals no gallop and no friction rub.   No murmur  heard. Pulmonary/Chest: Effort normal and breath sounds normal. No respiratory distress. He has no wheezes. He has no rales. He exhibits no tenderness.  Abdominal: Soft. Bowel sounds are normal. He exhibits no distension and no mass. There is no tenderness. There is no rebound and no guarding.  Genitourinary:       Per GI/Urol - recent  Musculoskeletal: Normal range of motion. He exhibits no edema and no tenderness.       LS NT  Lymphadenopathy:    He has no cervical adenopathy.  Neurological: He is alert and oriented to person, place, and time. He has normal reflexes. No cranial nerve deficit. He exhibits normal muscle tone. Coordination normal.  Skin: Skin is warm and dry. No rash noted.  Psychiatric: He has a normal mood and affect. His behavior is normal. Judgment and thought content normal.   Lab Results  Component Value Date   WBC 6.1 11/09/2011   HGB 15.4 11/09/2011   HCT 44.7 11/09/2011   PLT 217.0 11/09/2011   GLUCOSE 98 08/01/2012   CHOL 221* 08/01/2012   TRIG 96.0 08/01/2012   HDL 58.90 08/01/2012   LDLDIRECT 145.6 08/01/2012   LDLCALC 108* 11/09/2011   ALT 24 08/01/2012   AST 24 08/01/2012   NA 138 08/01/2012   K 4.0 08/01/2012   CL 102 08/01/2012   CREATININE 1.1 08/01/2012   BUN 20 08/01/2012   CO2 29 08/01/2012   TSH 1.18 08/25/2010   PSA 2.61 08/25/2010          Assessment & Plan:

## 2012-10-07 NOTE — Patient Instructions (Addendum)
Nl BP <130/85 

## 2012-12-18 ENCOUNTER — Other Ambulatory Visit: Payer: Self-pay | Admitting: *Deleted

## 2012-12-18 ENCOUNTER — Telehealth: Payer: Self-pay | Admitting: *Deleted

## 2012-12-18 MED ORDER — SIMVASTATIN 40 MG PO TABS: 40.0000 mg | ORAL_TABLET | Freq: Every day | ORAL | Status: DC

## 2012-12-18 NOTE — Telephone Encounter (Signed)
Pt calling requesting written Rx for Simvastatin 90 day supply be mailed to his home. Rx printed/pending MD sig until he returns tomorrow. Will mail Rx then. Pt is aware.

## 2013-10-13 ENCOUNTER — Other Ambulatory Visit: Payer: Self-pay | Admitting: Internal Medicine

## 2014-01-11 ENCOUNTER — Other Ambulatory Visit: Payer: Self-pay | Admitting: Internal Medicine

## 2014-04-19 ENCOUNTER — Other Ambulatory Visit: Payer: Self-pay | Admitting: *Deleted

## 2014-04-19 MED ORDER — SIMVASTATIN 40 MG PO TABS: ORAL_TABLET | ORAL | Status: DC

## 2014-04-23 ENCOUNTER — Other Ambulatory Visit: Payer: Self-pay

## 2014-04-23 NOTE — Telephone Encounter (Signed)
[facility] denied, pt need OV

## 2014-05-07 ENCOUNTER — Encounter: Payer: Self-pay | Admitting: Internal Medicine

## 2014-05-07 ENCOUNTER — Ambulatory Visit (INDEPENDENT_AMBULATORY_CARE_PROVIDER_SITE_OTHER): Payer: BC Managed Care – PPO | Admitting: Internal Medicine

## 2014-05-07 VITALS — BP 140/80 | HR 76 | Temp 98.5°F | Resp 16 | Ht 64.0 in | Wt 186.0 lb

## 2014-05-07 DIAGNOSIS — IMO0002 Reserved for concepts with insufficient information to code with codable children: Secondary | ICD-10-CM

## 2014-05-07 DIAGNOSIS — Z0000 Encounter for general adult medical examination without abnormal findings: Secondary | ICD-10-CM

## 2014-05-07 DIAGNOSIS — S93409A Sprain of unspecified ligament of unspecified ankle, initial encounter: Secondary | ICD-10-CM

## 2014-05-07 DIAGNOSIS — S93401A Sprain of unspecified ligament of right ankle, initial encounter: Secondary | ICD-10-CM | POA: Insufficient documentation

## 2014-05-07 DIAGNOSIS — S93401S Sprain of unspecified ligament of right ankle, sequela: Secondary | ICD-10-CM

## 2014-05-07 DIAGNOSIS — E785 Hyperlipidemia, unspecified: Secondary | ICD-10-CM

## 2014-05-07 MED ORDER — SIMVASTATIN 40 MG PO TABS: ORAL_TABLET | ORAL | Status: DC

## 2014-05-07 NOTE — Assessment & Plan Note (Signed)
We discussed age appropriate health related issues, including available/recomended screening tests and vaccinations. We discussed a need for adhering to healthy diet and exercise. Labs/EKG were reviewed/ordered. All questions were answered.  Colon 8/12 Dr Collene Mares - due in 5-10 years Labs

## 2014-05-07 NOTE — Progress Notes (Signed)
Pre visit review using our clinic review tool, if applicable. No additional management support is needed unless otherwise documented below in the visit note. 

## 2014-05-07 NOTE — Assessment & Plan Note (Signed)
Continue with current prescription therapy as reflected on the Med list.  

## 2014-05-07 NOTE — Progress Notes (Signed)
   Subjective:    HPI  The patient is here for a wellness exam. The patient has been doing well overall without major physical or psychological issues going on lately.   The patient presents for a follow-up of  chronic depression, chronic dyslipidemia,controlled with medicines. BP is ok at home.  C/o R ankle pain and swelling x2 in the past year - soccer and volleyball - [facility]   BP Readings from Last 3 Encounters:  05/07/14 160/90  10/07/12 160/100  05/02/12 136/94   Wt Readings from Last 3 Encounters:  05/07/14 186 lb (84.369 kg)  10/07/12 180 lb (81.647 kg)  05/02/12 182 lb (82.555 kg)      Review of Systems  Constitutional: Negative for appetite change, fatigue and unexpected weight change.  HENT: Negative for congestion, nosebleeds, sneezing, sore throat and trouble swallowing.   Eyes: Negative for itching and visual disturbance.  Respiratory: Negative for cough.   Cardiovascular: Negative for chest pain, palpitations and leg swelling.  Gastrointestinal: Negative for nausea, diarrhea, blood in stool and abdominal distention.  Genitourinary: Negative for frequency and hematuria.  Musculoskeletal: Positive for back pain. Negative for gait problem, joint swelling and neck pain.  Skin: Negative for rash.  Neurological: Negative for dizziness, tremors, speech difficulty and weakness.  Psychiatric/Behavioral: Positive for sleep disturbance. Negative for suicidal ideas, dysphoric mood and agitation. The patient is not nervous/anxious.        Objective:   Physical Exam  Constitutional: He is oriented to person, place, and time. He appears well-developed.  HENT:  Mouth/Throat: Oropharynx is clear and moist.  Eyes: Conjunctivae are normal. Pupils are equal, round, and reactive to light.  Neck: Normal range of motion. No JVD present. No thyromegaly present.  Cardiovascular: Normal rate, regular rhythm, normal heart sounds and intact distal pulses.  Exam reveals no  gallop and no friction rub.   No murmur heard. Pulmonary/Chest: Effort normal and breath sounds normal. No respiratory distress. He has no wheezes. He has no rales. He exhibits no tenderness.  Abdominal: Soft. Bowel sounds are normal. He exhibits no distension and no mass. There is no tenderness. There is no rebound and no guarding.  Genitourinary:  Per GI/Urol - recent  Musculoskeletal: Normal range of motion. He exhibits no edema and no tenderness.  LS NT  Lymphadenopathy:    He has no cervical adenopathy.  Neurological: He is alert and oriented to person, place, and time. He has normal reflexes. No cranial nerve deficit. He exhibits normal muscle tone. Coordination normal.  Skin: Skin is warm and dry. No rash noted.  Psychiatric: He has a normal mood and affect. His behavior is normal. Judgment and thought content normal.    Lab Results  Component Value Date   WBC 6.1 11/09/2011   HGB 15.4 11/09/2011   HCT 44.7 11/09/2011   PLT 217.0 11/09/2011   GLUCOSE 98 08/01/2012   CHOL 221* 08/01/2012   TRIG 96.0 08/01/2012   HDL 58.90 08/01/2012   LDLDIRECT 145.6 08/01/2012   LDLCALC 108* 11/09/2011   ALT 24 08/01/2012   AST 24 08/01/2012   NA 138 08/01/2012   K 4.0 08/01/2012   CL 102 08/01/2012   CREATININE 1.1 08/01/2012   BUN 20 08/01/2012   CO2 29 08/01/2012   TSH 1.18 08/25/2010   PSA 2.61 08/25/2010          Assessment & Plan:

## 2014-05-07 NOTE — Assessment & Plan Note (Signed)
2015 residual chronic swelling x1year Ortho f/up or Sports Med consult

## 2014-05-18 ENCOUNTER — Telehealth: Payer: Self-pay | Admitting: Internal Medicine

## 2014-05-18 NOTE — Telephone Encounter (Signed)
It was the one that you expected him to do it by 05/10/14. Not sure if the order is going to be good until 06/17/14 or do we need to enter another order?Please advise.

## 2014-05-18 NOTE — Telephone Encounter (Signed)
It is ok. Sorry about the foot - is it from 1 month ago or new? Thx

## 2014-05-18 NOTE — Telephone Encounter (Signed)
Pt called stated he broke his foot and he will is not able to come in and get blood work done until the next 4 weeks. Please advise, is this ok?

## 2014-05-19 NOTE — Telephone Encounter (Signed)
Pt is aware lab work next month, order is in the system, does not expire until 11/06/14.

## 2014-05-19 NOTE — Telephone Encounter (Signed)
pls check order expiration date Thx

## 2014-06-07 ENCOUNTER — Telehealth: Payer: Self-pay | Admitting: Internal Medicine

## 2014-06-07 MED ORDER — SIMVASTATIN 40 MG PO TABS: ORAL_TABLET | ORAL | Status: DC

## 2014-06-07 NOTE — Telephone Encounter (Signed)
Patient would like a written script for simvastatin to send in to his mail order company himself.

## 2014-06-07 NOTE — Telephone Encounter (Signed)
Rx printed, signed and pt notified to pick up. Pt request that rx mailed to his home address.

## 2014-06-25 ENCOUNTER — Other Ambulatory Visit (INDEPENDENT_AMBULATORY_CARE_PROVIDER_SITE_OTHER): Payer: BC Managed Care – PPO

## 2014-06-25 DIAGNOSIS — Z0000 Encounter for general adult medical examination without abnormal findings: Secondary | ICD-10-CM

## 2014-06-25 LAB — BASIC METABOLIC PANEL
BUN: 14 mg/dL (ref 6–23)
CALCIUM: 9.4 mg/dL (ref 8.4–10.5)
CO2: 28 mEq/L (ref 19–32)
Chloride: 105 mEq/L (ref 96–112)
Creatinine, Ser: 1 mg/dL (ref 0.4–1.5)
GFR: 80.36 mL/min (ref >60.00)
GLUCOSE: 95 mg/dL (ref 70–99)
Potassium: 4 mEq/L (ref 3.5–5.1)
SODIUM: 139 meq/L (ref 135–145)

## 2014-06-25 LAB — HEPATIC FUNCTION PANEL
ALBUMIN: 3.9 g/dL (ref 3.5–5.2)
ALT: 23 U/L (ref 0–53)
AST: 23 U/L (ref 0–37)
Alkaline Phosphatase: 72 U/L (ref 39–117)
Bilirubin, Direct: 0.1 mg/dL (ref 0.0–0.3)
Total Bilirubin: 0.7 mg/dL (ref 0.2–1.2)
Total Protein: 6.9 g/dL (ref 6.0–8.3)

## 2014-06-25 LAB — URINALYSIS, ROUTINE W REFLEX MICROSCOPIC
Bilirubin Urine: NEGATIVE
KETONES UR: NEGATIVE
Leukocytes, UA: NEGATIVE
Nitrite: NEGATIVE
SPECIFIC GRAVITY, URINE: 1.02 (ref 1.000–1.030)
Total Protein, Urine: NEGATIVE
URINE GLUCOSE: NEGATIVE
Urobilinogen, UA: 0.2 (ref 0.0–1.0)
pH: 6 (ref 5.0–8.0)

## 2014-06-25 LAB — LIPID PANEL
CHOLESTEROL: 202 mg/dL — AB (ref 0–200)
HDL: 50 mg/dL (ref >39.00)
LDL Cholesterol: 116 mg/dL — ABNORMAL HIGH (ref 0–99)
NonHDL: 152
Total CHOL/HDL Ratio: 4
Triglycerides: 179 mg/dL — ABNORMAL HIGH (ref 0.0–149.0)
VLDL: 35.8 mg/dL (ref 0.0–40.0)

## 2014-06-25 LAB — CBC WITH DIFFERENTIAL/PLATELET
BASOS ABS: 0 10*3/uL (ref 0.0–0.1)
Basophils Relative: 0.4 % (ref 0.0–3.0)
EOS ABS: 0.2 10*3/uL (ref 0.0–0.7)
Eosinophils Relative: 3.6 % (ref 0.0–5.0)
HEMATOCRIT: 43.7 % (ref 39.0–52.0)
HEMOGLOBIN: 14.9 g/dL (ref 13.0–17.0)
LYMPHS ABS: 1.8 10*3/uL (ref 0.7–4.0)
Lymphocytes Relative: 26.6 % (ref 12.0–46.0)
MCHC: 34 g/dL (ref 30.0–36.0)
MCV: 86.8 fl (ref 78.0–100.0)
Monocytes Absolute: 0.6 10*3/uL (ref 0.1–1.0)
Monocytes Relative: 8.2 % (ref 3.0–12.0)
NEUTROS ABS: 4.2 10*3/uL (ref 1.4–7.7)
Neutrophils Relative %: 61.2 % (ref 43.0–77.0)
Platelets: 249 10*3/uL (ref 150.0–400.0)
RBC: 5.03 Mil/uL (ref 4.22–5.81)
RDW: 13.6 % (ref 11.5–15.5)
WBC: 6.9 10*3/uL (ref 4.0–10.5)

## 2014-06-25 LAB — TSH: TSH: 1.26 u[IU]/mL (ref 0.35–4.50)

## 2014-07-15 ENCOUNTER — Ambulatory Visit (HOSPITAL_COMMUNITY): Payer: BC Managed Care – PPO | Attending: Cardiovascular Disease | Admitting: Cardiology

## 2014-07-15 ENCOUNTER — Other Ambulatory Visit (HOSPITAL_COMMUNITY): Payer: Self-pay | Admitting: Cardiology

## 2014-07-15 DIAGNOSIS — E785 Hyperlipidemia, unspecified: Secondary | ICD-10-CM | POA: Diagnosis not present

## 2014-07-15 DIAGNOSIS — M79609 Pain in unspecified limb: Secondary | ICD-10-CM | POA: Insufficient documentation

## 2014-07-15 DIAGNOSIS — M79605 Pain in left leg: Secondary | ICD-10-CM

## 2014-07-15 DIAGNOSIS — M7989 Other specified soft tissue disorders: Secondary | ICD-10-CM | POA: Insufficient documentation

## 2014-07-15 NOTE — Progress Notes (Signed)
Left lower extremity venous duplex performed  

## 2014-10-18 ENCOUNTER — Telehealth: Payer: Self-pay | Admitting: Internal Medicine

## 2014-10-18 MED ORDER — SIMVASTATIN 40 MG PO TABS: ORAL_TABLET | ORAL | Status: DC

## 2014-10-18 NOTE — Telephone Encounter (Signed)
Pt called in and is requesting a refill of simvastatin (ZOCOR) 40 MG tablet [[id number]] [facility]

## 2014-10-18 NOTE — Telephone Encounter (Signed)
Done

## 2015-02-02 ENCOUNTER — Telehealth: Payer: Self-pay | Admitting: Internal Medicine

## 2015-02-02 MED ORDER — SIMVASTATIN 40 MG PO TABS: ORAL_TABLET | ORAL | Status: DC

## 2015-02-02 NOTE — Telephone Encounter (Signed)
Pt called in and said that he needs a refill of his simvastatin (ZOCOR) 40 MG tablet [[id number] [facility]

## 2015-02-02 NOTE — Telephone Encounter (Signed)
Called pt no answer LMOM [facility]...Kenneth Orozco

## 2015-05-27 ENCOUNTER — Other Ambulatory Visit (INDEPENDENT_AMBULATORY_CARE_PROVIDER_SITE_OTHER): Payer: BLUE CROSS/BLUE SHIELD

## 2015-05-27 ENCOUNTER — Ambulatory Visit (INDEPENDENT_AMBULATORY_CARE_PROVIDER_SITE_OTHER): Payer: BLUE CROSS/BLUE SHIELD | Admitting: Internal Medicine

## 2015-05-27 ENCOUNTER — Encounter: Payer: Self-pay | Admitting: Internal Medicine

## 2015-05-27 VITALS — BP 130/84 | HR 57 | Ht 64.0 in | Wt 183.0 lb

## 2015-05-27 DIAGNOSIS — Z0000 Encounter for general adult medical examination without abnormal findings: Secondary | ICD-10-CM

## 2015-05-27 DIAGNOSIS — I10 Essential (primary) hypertension: Secondary | ICD-10-CM

## 2015-05-27 DIAGNOSIS — E785 Hyperlipidemia, unspecified: Secondary | ICD-10-CM | POA: Diagnosis not present

## 2015-05-27 LAB — HEPATIC FUNCTION PANEL
ALT: 20 U/L (ref 0–53)
AST: 23 U/L (ref 0–37)
Albumin: 4.2 g/dL (ref 3.5–5.2)
Alkaline Phosphatase: 73 U/L (ref 39–117)
Bilirubin, Direct: 0.1 mg/dL (ref 0.0–0.3)
Total Bilirubin: 0.5 mg/dL (ref 0.2–1.2)
Total Protein: 7.1 g/dL (ref 6.0–8.3)

## 2015-05-27 LAB — CBC WITH DIFFERENTIAL/PLATELET
Basophils Absolute: 0 10*3/uL (ref 0.0–0.1)
Basophils Relative: 0.5 % (ref 0.0–3.0)
Eosinophils Absolute: 0.2 10*3/uL (ref 0.0–0.7)
Eosinophils Relative: 2.2 % (ref 0.0–5.0)
HCT: 44.2 % (ref 39.0–52.0)
Hemoglobin: 15 g/dL (ref 13.0–17.0)
Lymphocytes Relative: 24.8 % (ref 12.0–46.0)
Lymphs Abs: 1.7 10*3/uL (ref 0.7–4.0)
MCHC: 33.9 g/dL (ref 30.0–36.0)
MCV: 86.1 fl (ref 78.0–100.0)
Monocytes Absolute: 0.6 10*3/uL (ref 0.1–1.0)
Monocytes Relative: 8.9 % (ref 3.0–12.0)
NEUTROS ABS: 4.3 10*3/uL (ref 1.4–7.7)
Neutrophils Relative %: 63.6 % (ref 43.0–77.0)
PLATELETS: 240 10*3/uL (ref 150.0–400.0)
RBC: 5.13 Mil/uL (ref 4.22–5.81)
RDW: 13.7 % (ref 11.5–15.5)
WBC: 6.8 10*3/uL (ref 4.0–10.5)

## 2015-05-27 LAB — LIPID PANEL
CHOLESTEROL: 190 mg/dL (ref 0–200)
HDL: 54 mg/dL (ref >39.00)
LDL Cholesterol: 119 mg/dL — ABNORMAL HIGH (ref 0–99)
NonHDL: 136
TRIGLYCERIDES: 84 mg/dL (ref 0.0–149.0)
Total CHOL/HDL Ratio: 4
VLDL: 16.8 mg/dL (ref 0.0–40.0)

## 2015-05-27 LAB — BASIC METABOLIC PANEL
BUN: 22 mg/dL (ref 6–23)
CALCIUM: 9.4 mg/dL (ref 8.4–10.5)
CHLORIDE: 104 meq/L (ref 96–112)
CO2: 29 mEq/L (ref 19–32)
Creatinine, Ser: 1.06 mg/dL (ref 0.40–1.50)
GFR: 77.47 mL/min (ref >60.00)
GLUCOSE: 90 mg/dL (ref 70–99)
Potassium: 4.3 mEq/L (ref 3.5–5.1)
Sodium: 140 mEq/L (ref 135–145)

## 2015-05-27 LAB — TSH: TSH: 1.01 u[IU]/mL (ref 0.35–4.50)

## 2015-05-27 MED ORDER — SIMVASTATIN 40 MG PO TABS: ORAL_TABLET | ORAL | Status: DC

## 2015-05-27 MED ORDER — VITAMIN D3 50 MCG (2000 UT) PO CAPS: 2000.0000 [IU] | ORAL_CAPSULE | Freq: Every day | ORAL | Status: AC

## 2015-05-27 NOTE — Progress Notes (Signed)
Pre visit review using our clinic review tool, if applicable. No additional management support is needed unless otherwise documented below in the visit note. 

## 2015-05-27 NOTE — Assessment & Plan Note (Addendum)
We discussed age appropriate health related issues, including available/recomended screening tests and vaccinations. We discussed a need for adhering to healthy diet and exercise. Labs were reviewed/ordered. All questions were answered. Colon 8/12 Dr Collene Mares - due in 5-10 years Rectal - per Urology

## 2015-05-27 NOTE — Progress Notes (Signed)
Subjective:  [name], male    DOB: [birth year]  Age: 54 y.o. MRN: [id number]  CC: Annual Exam   HPI Minas Bonser presents for well visit  Outpatient Prescriptions Prior to Visit  Medication Sig Dispense Refill  . Cholecalciferol (VITAMIN D PO) Take by mouth daily.      . sertraline (ZOLOFT) 100 MG tablet Take 1.5 tablets (150 mg total) by mouth daily. 135 tablet 3  . simvastatin (ZOCOR) 40 MG tablet TAKE 1 TABLET AT night 90 tablet 1   No facility-administered medications prior to visit.    ROS Review of Systems  Constitutional: Negative for appetite change, fatigue and unexpected weight change.  HENT: Negative for congestion, nosebleeds, sneezing, sore throat and trouble swallowing.   Eyes: Negative for itching and visual disturbance.  Respiratory: Negative for cough.   Cardiovascular: Negative for chest pain, palpitations and leg swelling.  Gastrointestinal: Negative for nausea, vomiting, diarrhea, blood in stool and abdominal distention.  Genitourinary: Negative for frequency and hematuria.  Musculoskeletal: Negative for back pain, joint swelling, gait problem and neck pain.  Skin: Negative for color change, rash and wound.  Neurological: Negative for dizziness, tremors, speech difficulty and weakness.  Psychiatric/Behavioral: Negative for suicidal ideas, sleep disturbance, dysphoric mood, decreased concentration and agitation. The patient is not nervous/anxious.     Objective:  BP 130/84 mmHg  Pulse 57  Ht 5\' 4"  (1.626 m)  Wt 183 lb (83.008 kg)  BMI 31.40 kg/m2  SpO2 97%  BP Readings from Last 3 Encounters:  05/27/15 130/84  05/07/14 140/80  10/07/12 160/100    Wt Readings from Last 3 Encounters:  05/27/15 183 lb (83.008 kg)  05/07/14 186 lb (84.369 kg)  10/07/12 180 lb (81.647 kg)    Physical Exam  Constitutional: He is oriented to person, place, and time. He appears well-developed. No distress.  NAD  HENT:  Mouth/Throat: Oropharynx is  clear and moist.  Eyes: Conjunctivae are normal. Pupils are equal, round, and reactive to light.  Neck: Normal range of motion. No JVD present. No thyromegaly present.  Cardiovascular: Normal rate, regular rhythm, normal heart sounds and intact distal pulses.  Exam reveals no gallop and no friction rub.   No murmur heard. Pulmonary/Chest: Effort normal and breath sounds normal. No respiratory distress. He has no wheezes. He has no rales. He exhibits no tenderness.  Abdominal: Soft. Bowel sounds are normal. He exhibits no distension and no mass. There is no tenderness. There is no rebound and no guarding.  Musculoskeletal: Normal range of motion. He exhibits no edema or tenderness.  Lymphadenopathy:    He has no cervical adenopathy.  Neurological: He is alert and oriented to person, place, and time. He has normal reflexes. No cranial nerve deficit. He exhibits normal muscle tone. He displays a negative Romberg sign. Coordination and gait normal.  Skin: Skin is warm and dry. No rash noted.  Psychiatric: He has a normal mood and affect. His behavior is normal. Judgment and thought content normal.  Rectal - per urology  Lab Results  Component Value Date   WBC 6.9 06/25/2014   HGB 14.9 06/25/2014   HCT 43.7 06/25/2014   PLT 249.0 06/25/2014   GLUCOSE 95 06/25/2014   CHOL 202* 06/25/2014   TRIG 179.0* 06/25/2014   HDL 50.00 06/25/2014   LDLDIRECT 145.6 08/01/2012   LDLCALC 116* 06/25/2014   ALT 23 06/25/2014   AST 23 06/25/2014   NA 139 06/25/2014   K 4.0 06/25/2014   CL  105 06/25/2014   CREATININE 1.0 06/25/2014   BUN 14 06/25/2014   CO2 28 06/25/2014   TSH 1.26 06/25/2014   PSA 2.61 08/25/2010    Dg Lumbar Spine 2-3 Views  10/15/2011   *RADIOLOGY REPORT*  Clinical Data: Low back pain, recently lifted heavy couch  LUMBAR SPINE - 2-3 VIEW  Comparison: None.  Findings: The lumbar vertebrae are straightened in alignment. Intervertebral disc spaces appear normal.  No compression  deformity is seen.  The SI joints appear normal.  IMPRESSION: Straightened alignment.  Normal intervertebral disc spaces.  Original Report Authenticated By: Joretta Bachelor, M.D.  EKG WNL    Assessment & Plan:   There are no diagnoses linked to this encounter. I am having Mr. Oak maintain his Cholecalciferol (VITAMIN D PO), sertraline, and simvastatin.  No orders of the defined types were placed in this encounter.     Follow-up: No Follow-up on file.  Walker Kehr, MD

## 2015-05-27 NOTE — Assessment & Plan Note (Addendum)
BP Readings from Last 3 Encounters:  05/27/15 130/84  05/07/14 140/80  10/07/12 160/100   Not taking meds Loosing wt, running

## 2015-05-27 NOTE — Assessment & Plan Note (Signed)
On Rx 

## 2016-07-03 ENCOUNTER — Other Ambulatory Visit: Payer: Self-pay | Admitting: Internal Medicine

## 2016-09-03 ENCOUNTER — Other Ambulatory Visit: Payer: Self-pay | Admitting: *Deleted

## 2016-09-03 MED ORDER — SIMVASTATIN 40 MG PO TABS: 40.0000 mg | ORAL_TABLET | Freq: Every day | ORAL | 0 refills | Status: DC

## 2016-09-03 NOTE — Telephone Encounter (Signed)
Pt left msg on triage stating he is needing an emergency refill on his simvastatin. He is leaving going out of town and needing to pick med up today...Johny Chess

## 2016-09-03 NOTE — Telephone Encounter (Signed)
PT WAS ADVISE AT LAST REFILL THAT MD WOULD NOT GIVE ANYMORE REFILLS DUE TO THE FACT HE HAS NOT BEEN IN SINCE 05/2015. DENIED REQUEST...Johny Chess

## 2016-09-03 NOTE — Telephone Encounter (Signed)
Rf sent for 15 day supply. See meds. OV scheduled 09/26/16. He is aware he will receive further refills on all maintenance meds then.

## 2016-09-03 NOTE — Addendum Note (Signed)
Addended by: Cresenciano Lick on: 09/03/2016 04:29 PM   Modules accepted: Orders

## 2016-09-18 DIAGNOSIS — Z23 Encounter for immunization: Secondary | ICD-10-CM | POA: Diagnosis not present

## 2016-09-26 ENCOUNTER — Ambulatory Visit: Payer: BLUE CROSS/BLUE SHIELD | Admitting: Internal Medicine

## 2016-10-01 ENCOUNTER — Other Ambulatory Visit: Payer: Self-pay | Admitting: Internal Medicine

## 2016-10-02 DIAGNOSIS — Z1211 Encounter for screening for malignant neoplasm of colon: Secondary | ICD-10-CM | POA: Diagnosis not present

## 2016-10-02 DIAGNOSIS — K5730 Diverticulosis of large intestine without perforation or abscess without bleeding: Secondary | ICD-10-CM | POA: Diagnosis not present

## 2016-10-02 DIAGNOSIS — K219 Gastro-esophageal reflux disease without esophagitis: Secondary | ICD-10-CM | POA: Diagnosis not present

## 2016-10-04 ENCOUNTER — Other Ambulatory Visit (INDEPENDENT_AMBULATORY_CARE_PROVIDER_SITE_OTHER): Payer: BLUE CROSS/BLUE SHIELD

## 2016-10-04 ENCOUNTER — Ambulatory Visit (INDEPENDENT_AMBULATORY_CARE_PROVIDER_SITE_OTHER): Payer: BLUE CROSS/BLUE SHIELD | Admitting: Internal Medicine

## 2016-10-04 ENCOUNTER — Encounter: Payer: Self-pay | Admitting: Internal Medicine

## 2016-10-04 DIAGNOSIS — Z0000 Encounter for general adult medical examination without abnormal findings: Secondary | ICD-10-CM

## 2016-10-04 LAB — CBC WITH DIFFERENTIAL/PLATELET
BASOS ABS: 0 10*3/uL (ref 0.0–0.1)
BASOS PCT: 0.5 % (ref 0.0–3.0)
Eosinophils Absolute: 0.2 10*3/uL (ref 0.0–0.7)
Eosinophils Relative: 3.1 % (ref 0.0–5.0)
HEMATOCRIT: 45.4 % (ref 39.0–52.0)
HEMOGLOBIN: 15.4 g/dL (ref 13.0–17.0)
LYMPHS PCT: 27.1 % (ref 12.0–46.0)
Lymphs Abs: 1.7 10*3/uL (ref 0.7–4.0)
MCHC: 33.9 g/dL (ref 30.0–36.0)
MCV: 87.6 fl (ref 78.0–100.0)
MONOS PCT: 9.1 % (ref 3.0–12.0)
Monocytes Absolute: 0.6 10*3/uL (ref 0.1–1.0)
NEUTROS ABS: 3.7 10*3/uL (ref 1.4–7.7)
Neutrophils Relative %: 60.2 % (ref 43.0–77.0)
PLATELETS: 247 10*3/uL (ref 150.0–400.0)
RBC: 5.18 Mil/uL (ref 4.22–5.81)
RDW: 13.5 % (ref 11.5–15.5)
WBC: 6.2 10*3/uL (ref 4.0–10.5)

## 2016-10-04 LAB — HEPATIC FUNCTION PANEL
ALT: 18 U/L (ref 0–53)
AST: 19 U/L (ref 0–37)
Albumin: 4.3 g/dL (ref 3.5–5.2)
Alkaline Phosphatase: 74 U/L (ref 39–117)
BILIRUBIN DIRECT: 0.1 mg/dL (ref 0.0–0.3)
BILIRUBIN TOTAL: 0.6 mg/dL (ref 0.2–1.2)
TOTAL PROTEIN: 7.1 g/dL (ref 6.0–8.3)

## 2016-10-04 LAB — BASIC METABOLIC PANEL
BUN: 18 mg/dL (ref 6–23)
CALCIUM: 9.2 mg/dL (ref 8.4–10.5)
CO2: 29 mEq/L (ref 19–32)
CREATININE: 1.01 mg/dL (ref 0.40–1.50)
Chloride: 104 mEq/L (ref 96–112)
GFR: 81.49 mL/min (ref >60.00)
Glucose, Bld: 100 mg/dL — ABNORMAL HIGH (ref 70–99)
Potassium: 4.5 mEq/L (ref 3.5–5.1)
Sodium: 139 mEq/L (ref 135–145)

## 2016-10-04 LAB — URINALYSIS, ROUTINE W REFLEX MICROSCOPIC
BILIRUBIN URINE: NEGATIVE
Ketones, ur: NEGATIVE
Leukocytes, UA: NEGATIVE
NITRITE: NEGATIVE
PH: 6 (ref 5.0–8.0)
Specific Gravity, Urine: 1.025 (ref 1.000–1.030)
Total Protein, Urine: NEGATIVE
Urine Glucose: NEGATIVE
Urobilinogen, UA: 0.2 (ref 0.0–1.0)

## 2016-10-04 LAB — LIPID PANEL
Cholesterol: 214 mg/dL — ABNORMAL HIGH (ref 0–200)
HDL: 62.6 mg/dL (ref >39.00)
LDL Cholesterol: 125 mg/dL — ABNORMAL HIGH (ref 0–99)
NONHDL: 151.63
Total CHOL/HDL Ratio: 3
Triglycerides: 131 mg/dL (ref 0.0–149.0)
VLDL: 26.2 mg/dL (ref 0.0–40.0)

## 2016-10-04 LAB — TSH: TSH: 1.13 u[IU]/mL (ref 0.35–4.50)

## 2016-10-04 MED ORDER — SIMVASTATIN 40 MG PO TABS: 40.0000 mg | ORAL_TABLET | Freq: Every day | ORAL | 3 refills | Status: DC

## 2016-10-04 NOTE — Progress Notes (Signed)
Pre visit review using our clinic review tool, if applicable. No additional management support is needed unless otherwise documented below in the visit note. 

## 2016-10-04 NOTE — Progress Notes (Signed)
Subjective:  [name], male    DOB: 06-15-1961  Age: 55 y.o. MRN: [id number]  CC: No chief complaint on file.   HPI Kenneth Orozco presents for a well exam.  Dr Kenneth Orozco said colon is due in 2022. PSA per Dr Kenneth Orozco  Outpatient Medications Prior to Visit  Medication Sig Dispense Refill  . Cholecalciferol (VITAMIN D3) 2000 UNITS capsule Take 1 capsule (2,000 Units total) by mouth daily. 100 capsule 3  . sertraline (ZOLOFT) 100 MG tablet Take 1.5 tablets (150 mg total) by mouth daily. 135 tablet 3  . simvastatin (ZOCOR) 40 MG tablet Take 1 tablet (40 mg total) by mouth at bedtime. 15 tablet 0   No facility-administered medications prior to visit.     ROS Review of Systems  Constitutional: Negative for appetite change, fatigue and unexpected weight change.  HENT: Negative for congestion, nosebleeds, sneezing, sore throat and trouble swallowing.   Eyes: Negative for itching and visual disturbance.  Respiratory: Negative for cough.   Cardiovascular: Negative for chest pain, palpitations and leg swelling.  Gastrointestinal: Negative for abdominal distention, blood in stool, diarrhea and nausea.  Genitourinary: Negative for frequency and hematuria.  Musculoskeletal: Negative for back pain, gait problem, joint swelling and neck pain.  Skin: Negative for rash.  Neurological: Negative for dizziness, tremors, speech difficulty and weakness.  Psychiatric/Behavioral: Negative for agitation, dysphoric mood, sleep disturbance and suicidal ideas. The patient is not nervous/anxious.     Objective:  BP 122/68   Pulse 60   Temp 98.2 F (36.8 C)   Wt 187 lb (84.8 kg)   SpO2 98%   BMI 32.10 kg/m   BP Readings from Last 3 Encounters:  10/04/16 122/68  05/27/15 130/84  05/07/14 140/80    Wt Readings from Last 3 Encounters:  10/04/16 187 lb (84.8 kg)  05/27/15 183 lb (83 kg)  05/07/14 186 lb (84.4 kg)    Physical Exam  Constitutional: He is oriented to person, place, and  time. He appears well-developed. No distress.  NAD  HENT:  Mouth/Throat: Oropharynx is clear and moist.  Eyes: Conjunctivae are normal. Pupils are equal, round, and reactive to light.  Neck: Normal range of motion. No JVD present. No thyromegaly present.  Cardiovascular: Normal rate, regular rhythm, normal heart sounds and intact distal pulses.  Exam reveals no gallop and no friction rub.   No murmur heard. Pulmonary/Chest: Effort normal and breath sounds normal. No respiratory distress. He has no wheezes. He has no rales. He exhibits no tenderness.  Abdominal: Soft. Bowel sounds are normal. He exhibits no distension and no mass. There is no tenderness. There is no rebound and no guarding.  Musculoskeletal: Normal range of motion. He exhibits no edema or tenderness.  Lymphadenopathy:    He has no cervical adenopathy.  Neurological: He is alert and oriented to person, place, and time. He has normal reflexes. No cranial nerve deficit. He exhibits normal muscle tone. He displays a negative Romberg sign. Coordination and gait normal.  Skin: Skin is warm and dry. No rash noted.  Psychiatric: He has a normal mood and affect. His behavior is normal. Judgment and thought content normal.  Declined rectal (per Urology, GI)  Lab Results  Component Value Date   WBC 6.8 05/27/2015   HGB 15.0 05/27/2015   HCT 44.2 05/27/2015   PLT 240.0 05/27/2015   GLUCOSE 90 05/27/2015   CHOL 190 05/27/2015   TRIG 84.0 05/27/2015   HDL 54.00 05/27/2015   LDLDIRECT 145.6 08/01/2012  LDLCALC 119 (H) 05/27/2015   ALT 20 05/27/2015   AST 23 05/27/2015   NA 140 05/27/2015   K 4.3 05/27/2015   CL 104 05/27/2015   CREATININE 1.06 05/27/2015   BUN 22 05/27/2015   CO2 29 05/27/2015   TSH 1.01 05/27/2015   PSA 2.61 08/25/2010    Dg Lumbar Spine 2-3 Views  Result Date: 10/15/2011 *RADIOLOGY REPORT* Clinical Data: Low back pain, recently lifted heavy couch LUMBAR SPINE - 2-3 VIEW Comparison: None. Findings: The  lumbar vertebrae are straightened in alignment. Intervertebral disc spaces appear normal.  No compression deformity is seen.  The SI joints appear normal. IMPRESSION: Straightened alignment.  Normal intervertebral disc spaces. Original Report Authenticated By: Joretta Bachelor, M.D.   Assessment & Plan:   There are no diagnoses linked to this encounter. I am having [name] maintain his sertraline, Vitamin D3, and simvastatin.  No orders of the defined types were placed in this encounter.    Follow-up: No Follow-up on file.  Walker Kehr, MD

## 2016-10-04 NOTE — Assessment & Plan Note (Addendum)
We discussed age appropriate health related issues, including available/recomended screening tests and vaccinations. We discussed a need for adhering to healthy diet and exercise. Labs ordered. All questions were answered.  Dr Collene Mares said colon is due in 2022. PSA per Dr Diona Fanti

## 2016-10-05 LAB — HEPATITIS C ANTIBODY: HCV AB: NEGATIVE

## 2017-01-09 DIAGNOSIS — D176 Benign lipomatous neoplasm of spermatic cord: Secondary | ICD-10-CM | POA: Diagnosis not present

## 2017-01-09 DIAGNOSIS — N400 Enlarged prostate without lower urinary tract symptoms: Secondary | ICD-10-CM | POA: Diagnosis not present

## 2017-01-09 DIAGNOSIS — R972 Elevated prostate specific antigen [PSA]: Secondary | ICD-10-CM | POA: Diagnosis not present

## 2017-01-30 DIAGNOSIS — F3341 Major depressive disorder, recurrent, in partial remission: Secondary | ICD-10-CM | POA: Diagnosis not present

## 2017-05-07 DIAGNOSIS — S83241A Other tear of medial meniscus, current injury, right knee, initial encounter: Secondary | ICD-10-CM | POA: Diagnosis not present

## 2017-06-12 DIAGNOSIS — F3341 Major depressive disorder, recurrent, in partial remission: Secondary | ICD-10-CM | POA: Diagnosis not present

## 2017-07-15 DIAGNOSIS — R972 Elevated prostate specific antigen [PSA]: Secondary | ICD-10-CM | POA: Diagnosis not present

## 2017-07-15 DIAGNOSIS — D176 Benign lipomatous neoplasm of spermatic cord: Secondary | ICD-10-CM | POA: Diagnosis not present

## 2017-09-05 DIAGNOSIS — Z23 Encounter for immunization: Secondary | ICD-10-CM | POA: Diagnosis not present

## 2017-09-30 ENCOUNTER — Other Ambulatory Visit: Payer: Self-pay | Admitting: Internal Medicine

## 2017-11-08 ENCOUNTER — Ambulatory Visit: Payer: Self-pay | Admitting: *Deleted

## 2017-11-08 ENCOUNTER — Ambulatory Visit: Payer: BLUE CROSS/BLUE SHIELD | Admitting: Family Medicine

## 2017-11-08 ENCOUNTER — Encounter: Payer: Self-pay | Admitting: Family Medicine

## 2017-11-08 ENCOUNTER — Ambulatory Visit (INDEPENDENT_AMBULATORY_CARE_PROVIDER_SITE_OTHER): Payer: BLUE CROSS/BLUE SHIELD

## 2017-11-08 VITALS — BP 136/78 | HR 84 | Temp 97.9°F | Ht 64.0 in | Wt 180.0 lb

## 2017-11-08 DIAGNOSIS — M545 Low back pain, unspecified: Secondary | ICD-10-CM

## 2017-11-08 DIAGNOSIS — M5136 Other intervertebral disc degeneration, lumbar region: Secondary | ICD-10-CM | POA: Diagnosis not present

## 2017-11-08 DIAGNOSIS — M47816 Spondylosis without myelopathy or radiculopathy, lumbar region: Secondary | ICD-10-CM | POA: Diagnosis not present

## 2017-11-08 DIAGNOSIS — M48061 Spinal stenosis, lumbar region without neurogenic claudication: Secondary | ICD-10-CM | POA: Diagnosis not present

## 2017-11-08 IMAGING — DX DG SACRUM/COCCYX 2+V
4 series · 4 of 4 positions shown · non-contrast
Comparison: Lumbar spine films [DATE]

CLINICAL DATA: Low back pain

EXAM:
SACRUM AND COCCYX - 2+ VIEW

[sacrum 20° caudo-cranial ap]
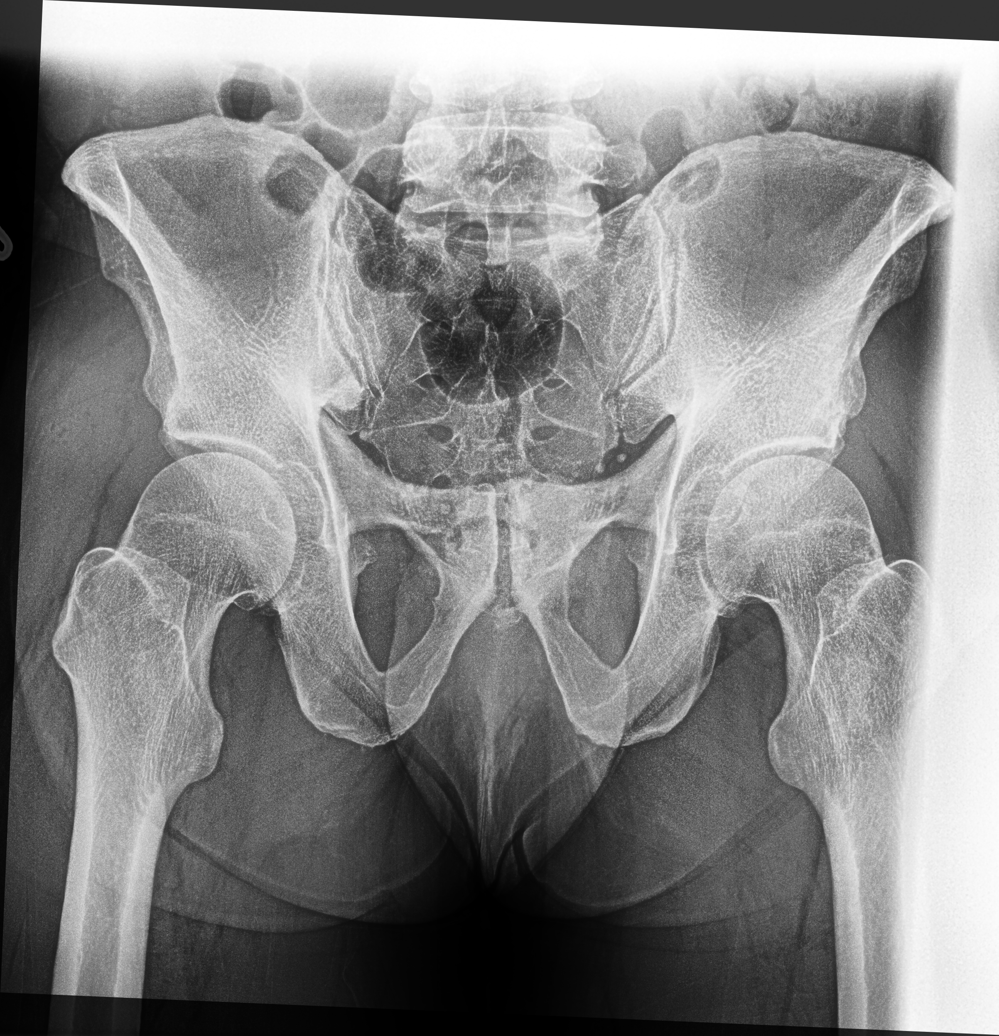

[sacrum lat]
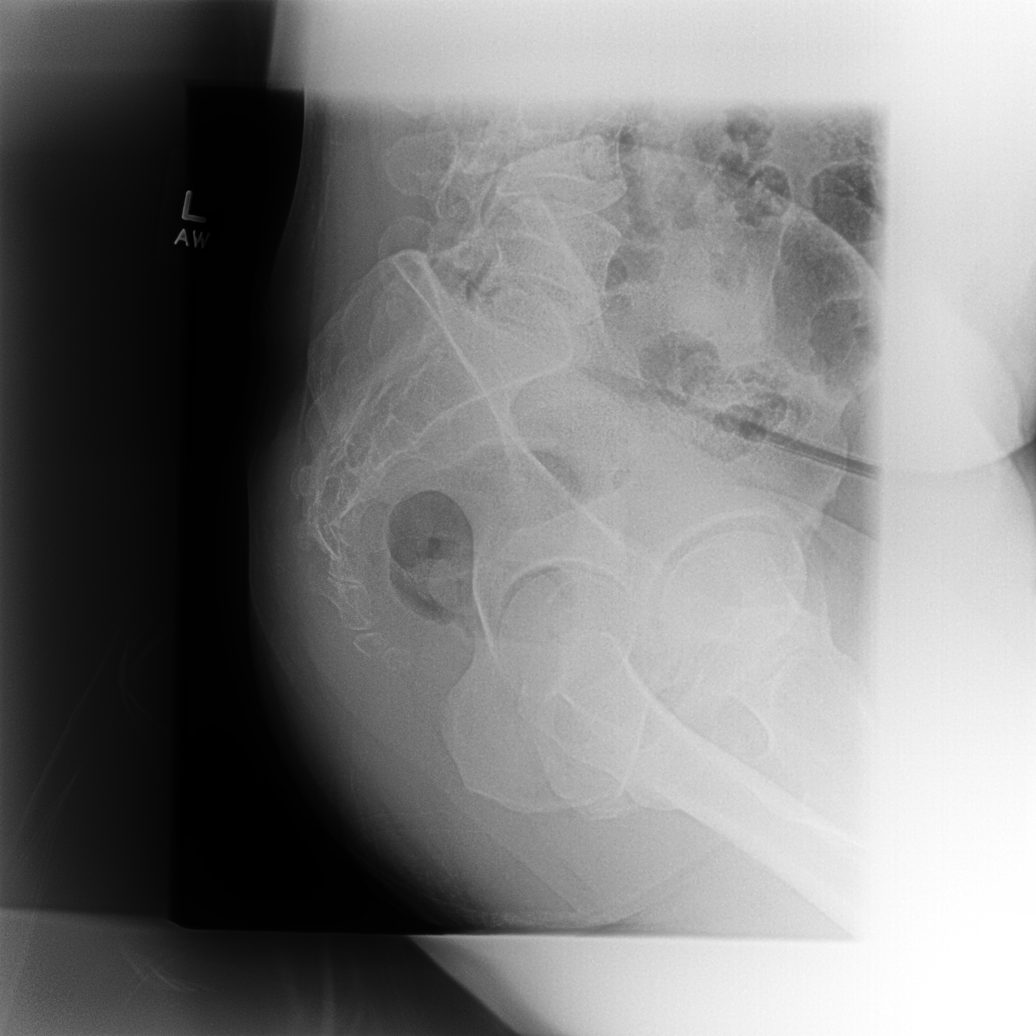

[coccyx 20° cranio-caudal ap]
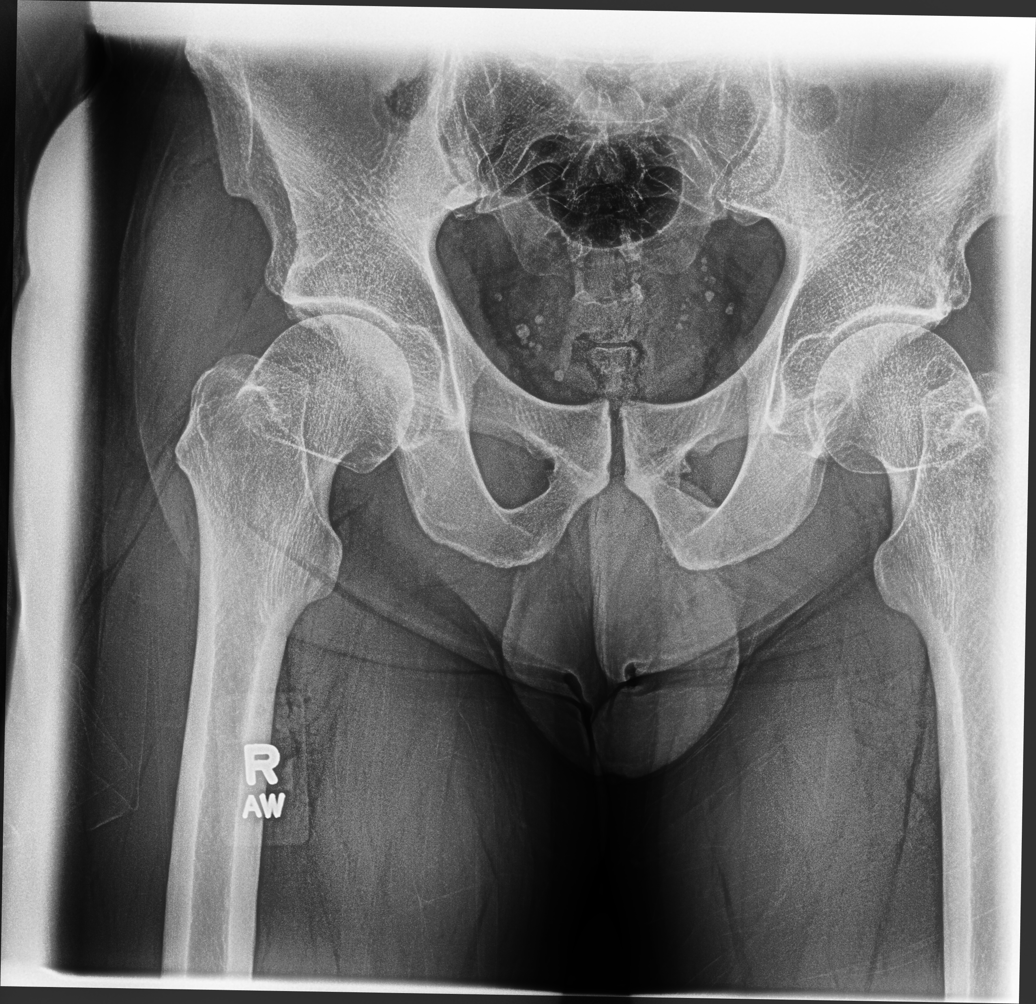

[coccyx lat]
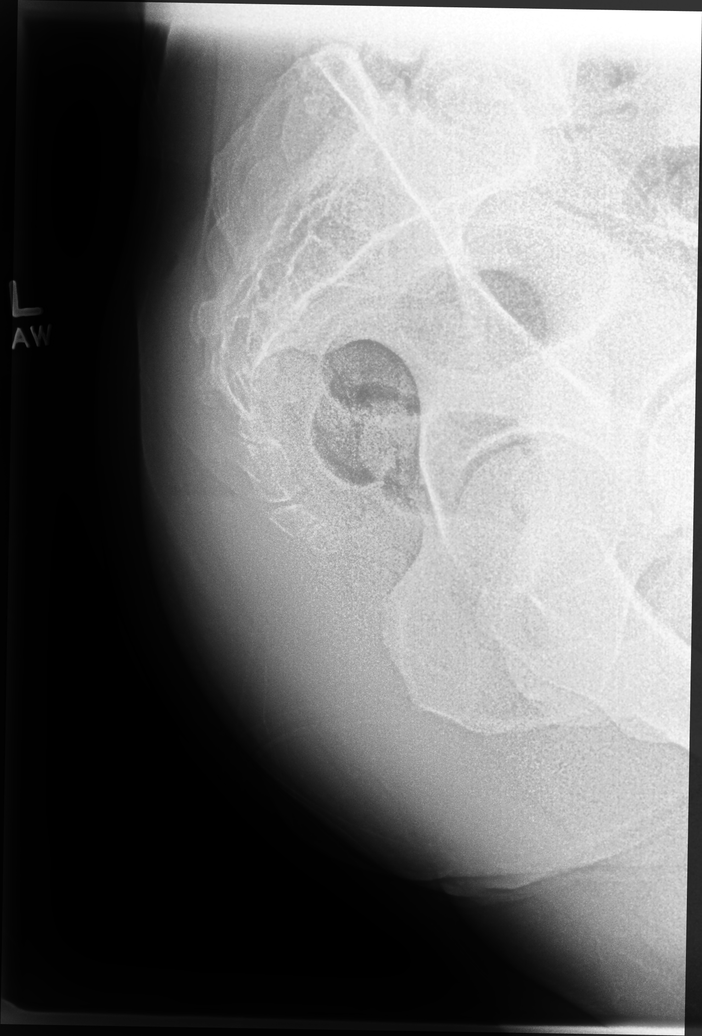

[4 of 4 positions shown; findings below may reference images not displayed]

FINDINGS: There is no evidence of fracture or other focal bone lesions. SI
joints and hip joints are symmetric and unremarkable.
IMPRESSION: Negative.

## 2017-11-08 IMAGING — DX DG LUMBAR SPINE 2-3V
3 series · 3 of 3 positions shown · non-contrast
Comparison: None.

CLINICAL DATA: Low back pain 1 month. Pain worse after playing
soccer last night.

EXAM:
LUMBAR SPINE - 2-3 VIEW

[lumbar spine ap]
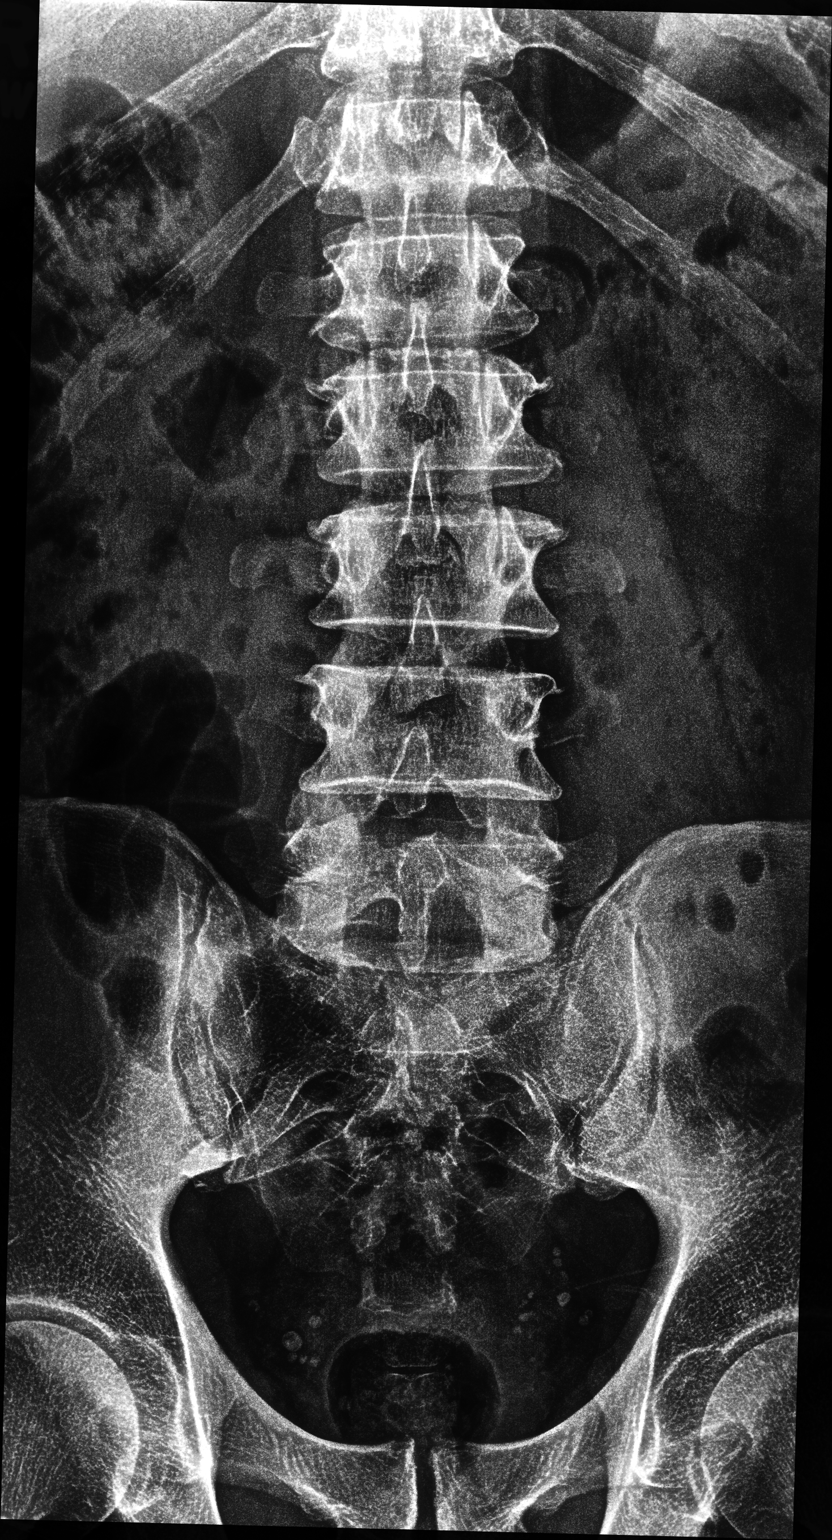

[lumbar spine lat (1 of 2)]
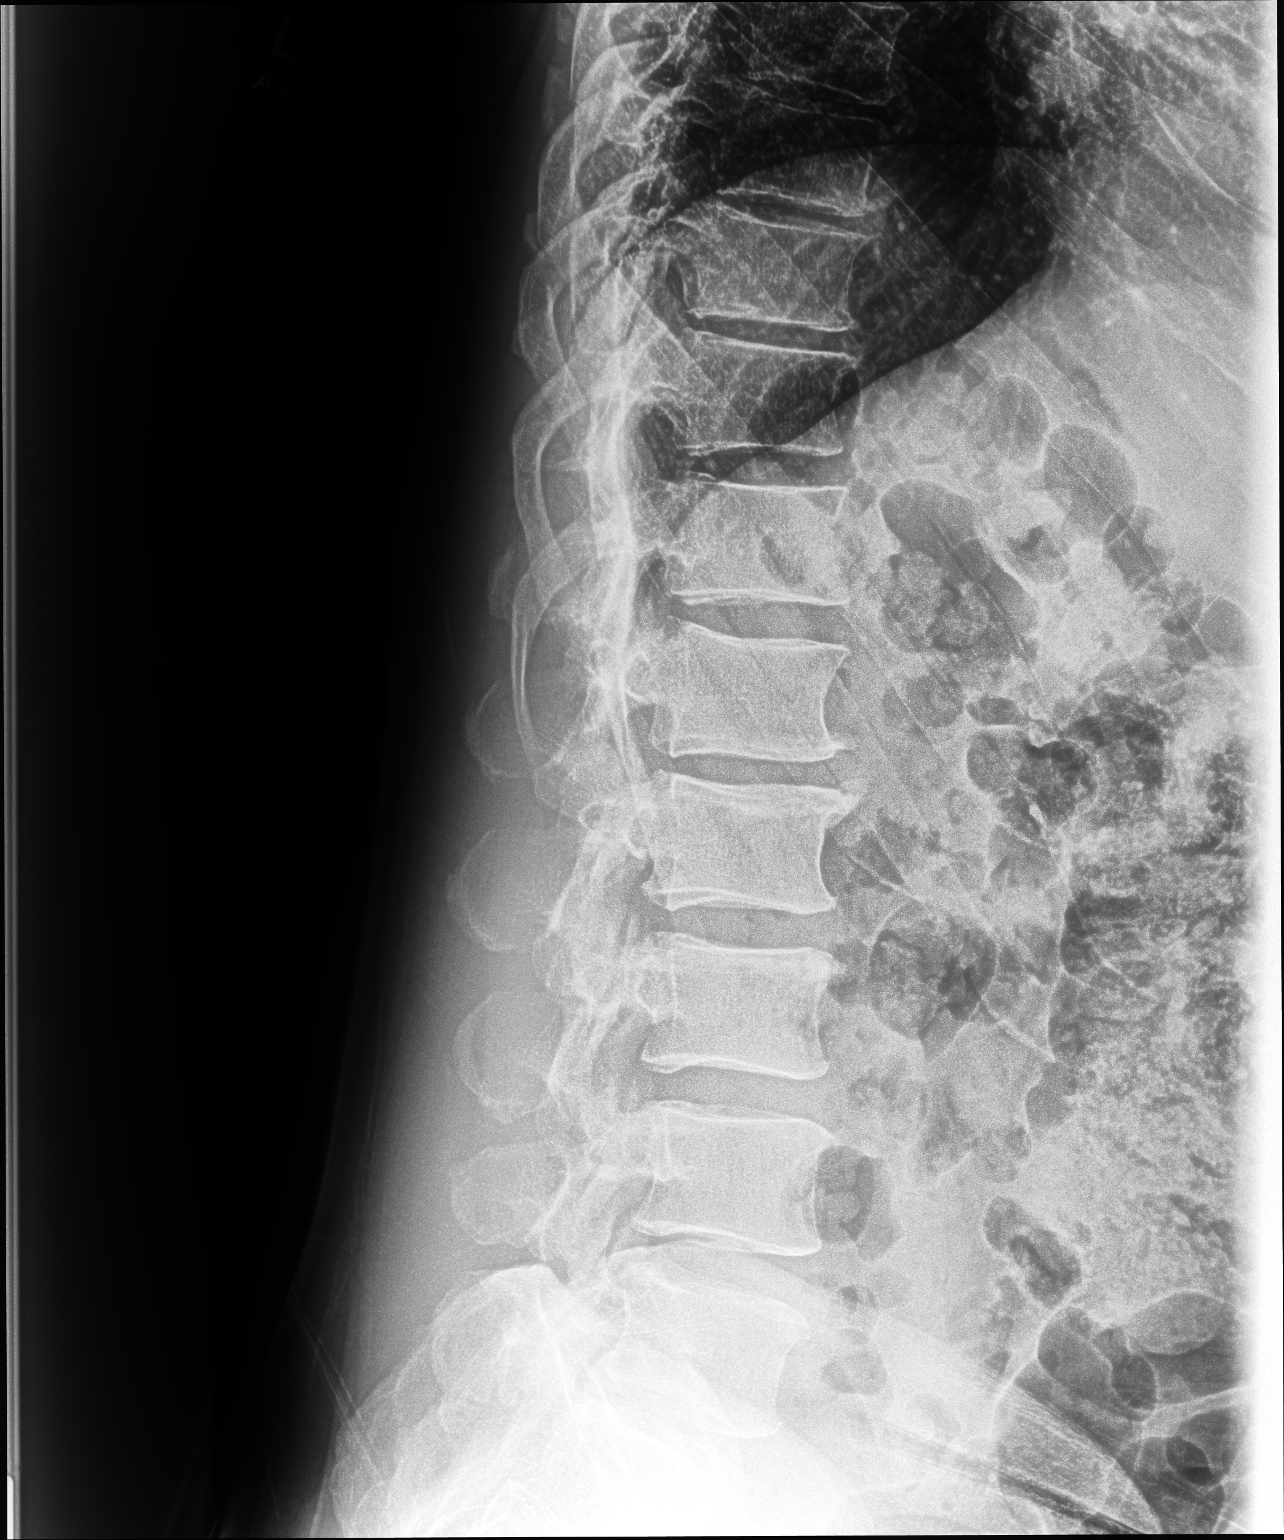

[lumbar spine lat (2 of 2)]
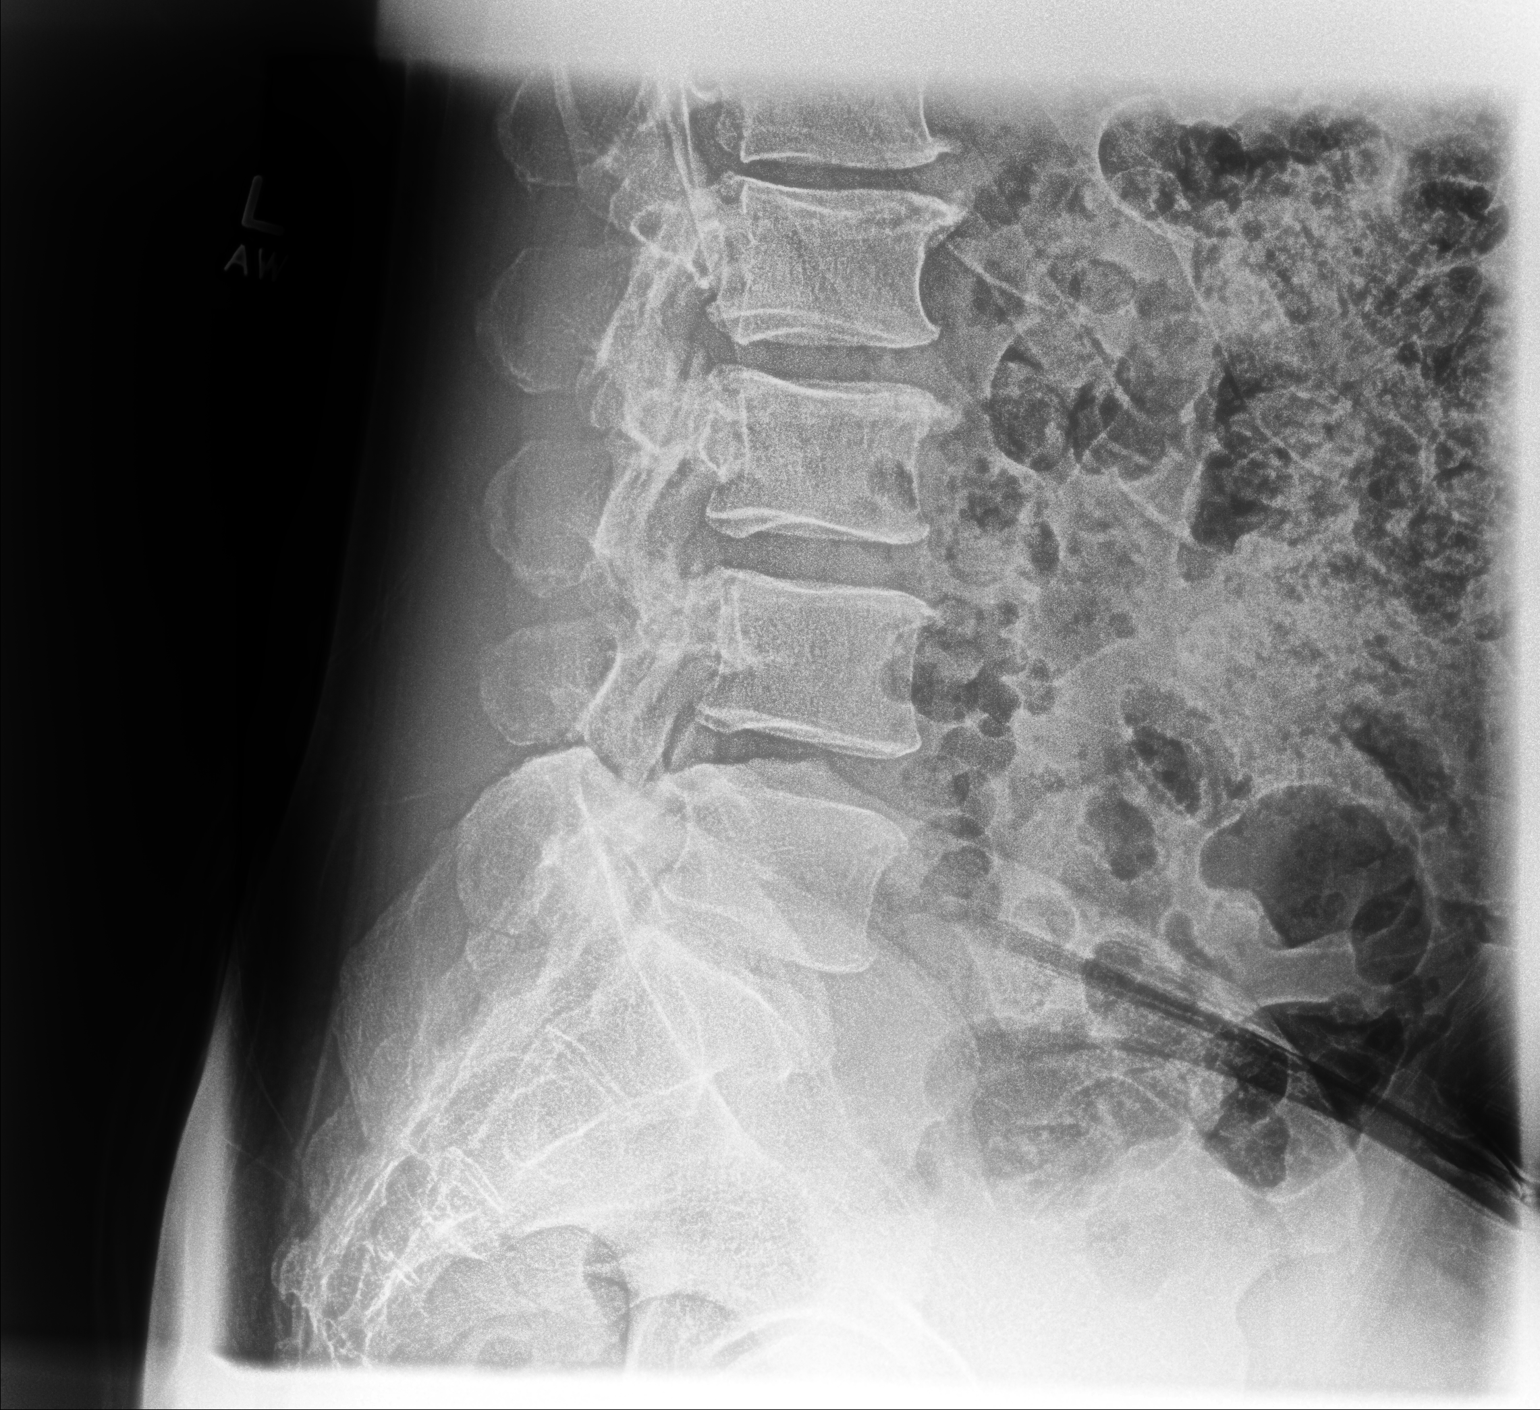

[3 of 3 positions shown; findings below may reference images not displayed]

FINDINGS: Vertebral body alignment and heights are normal. Minimal disc space
narrowing at the L1-2 and L5-S1 levels. No compression fracture or
spondylolisthesis. Facet arthropathy over the lower lumbar spine.
IMPRESSION: No acute findings.

Mild spondylosis of the lumbar spine with mild disc disease at the
L1-2 and L5-S1 levels.

## 2017-11-08 MED ORDER — CYCLOBENZAPRINE HCL 10 MG PO TABS: 10.0000 mg | ORAL_TABLET | Freq: Three times a day (TID) | ORAL | 1 refills | Status: DC | PRN

## 2017-11-08 MED ORDER — MELOXICAM 15 MG PO TABS: 15.0000 mg | ORAL_TABLET | Freq: Every day | ORAL | 0 refills | Status: DC

## 2017-11-08 MED ORDER — METHYLPREDNISOLONE ACETATE 80 MG/ML IJ SUSP
80.0000 mg | Freq: Once | INTRAMUSCULAR | Status: AC
  Administered 2017-11-08: 80 mg via INTRAMUSCULAR

## 2017-11-08 MED ORDER — TRAMADOL HCL 50 MG PO TABS: 50.0000 mg | ORAL_TABLET | Freq: Three times a day (TID) | ORAL | 0 refills | Status: DC | PRN

## 2017-11-08 NOTE — Telephone Encounter (Signed)
started 2 weeks ago intermittently; last night described as shooting pain in tail bone area/lower back; pt states that he went for a run yesterday and that is when symptoms worsened; spoke with Almyra Free at [facility] and no available; Almyra Free offered and scheduled pt an appointment with Clearance Coots at 19 at [facility] [place] office; pt verifies understanding   Reason for Disposition . [1] SEVERE back pain (e.g., excruciating, unable to do any normal activities) AND [2] not improved 2 hours after pain medicine  Answer Assessment - Initial Assessment Questions 1. ONSET: "When did the pain begin?"      2 weeks ago 2. LOCATION: "Where does it hurt?" (upper, mid or lower back)     lower back and tail bone 3. SEVERITY: "How bad is the pain?"  (e.g., Scale 1-10; mild, moderate, or severe)   - MILD (1-3): doesn't interfere with normal activities    - MODERATE (4-7): interferes with normal activities or awakens from sleep    - SEVERE (8-10): excruciating pain, unable to do any normal activities      9.5 4. PATTERN: "Is the pain constant?" (e.g., yes, no; constant, intermittent)      contsant 5. RADIATION: "Does the pain shoot into your legs or elsewhere?"   "a hair" 6. CAUSE:  "What do you think is causing the back pain?"      unsure 7. BACK OVERUSE:  "Any recent lifting of heavy objects, strenuous work or exercise?"     Ran yesterday 8. MEDICATIONS: "What have you taken so far for the pain?" (e.g., nothing, acetaminophen, NSAIDS)     nothing 9. NEUROLOGIC SYMPTOMS: "Do you have any weakness, numbness, or problems with bowel/bladder control?"    Yes bowel pain 10. OTHER SYMPTOMS: "Do you have any other symptoms?" (e.g., fever, abdominal pain, burning with urination, blood in urine)      no 11. PREGNANCY: "Is there any chance you are pregnant?" (e.g., yes, no; LMP)       n/a  Protocols used: BACK PAIN-A-AH

## 2017-11-08 NOTE — Assessment & Plan Note (Signed)
This appears to be muscular in nature. He does have pain with straight leg testing but does not appear to be sciatica. - X-rays - IM depo - Meloxicam and Flexeril - Counseled on home exercises. - If no improvement consider physical therapy

## 2017-11-08 NOTE — Progress Notes (Signed)
Kenneth Orozco - 56 y.o. male MRN [id number]  Date of birth: 17-Mar-1961  SUBJECTIVE:  Including CC & ROS.  Chief Complaint  Patient presents with  . Back Pain    Kenneth Orozco is a 56 y.o. male that is presenting with low back pain and sacrum pain. Pain is located in his lower back, near his tailbone. He has been  having  intermittent back pain for one month. Pain increased last night after playing soccer. He is very active daily. Denies previous injury or surgeries. He has been applying heat and taking motrin with no improvement. Describes the pain as sharp stabbing pain. Laying down is less painful. Certain positions trigger the pain. Denies numbness, admits to tingling. Denies any radiculopathy symptoms. The pain is significantly painful and he has trouble moving around.  Independent review of the lumbar spine from 2012 shows loss of lordosis but normal exam   Review of Systems  Constitutional: Negative for fever.  Respiratory: Negative for shortness of breath.   Cardiovascular: Negative for chest pain.  Gastrointestinal: Negative for abdominal pain.  Musculoskeletal: Positive for back pain and myalgias. Negative for joint swelling.  Skin: Negative for color change.  Neurological: Negative for weakness.  Hematological: Negative for adenopathy.  Psychiatric/Behavioral: Negative for agitation.    HISTORY: Past Medical, Surgical, Social, and Family History Reviewed & Updated per EMR.   Pertinent Historical Findings include:  Past Medical History:  Diagnosis Date  . Depression   . Hyperlipidemia   . Hypertension   . Rectal bleeding     Past Surgical History:  Procedure Laterality Date  . ANAL FISSURE REPAIR    . PROSTATE BIOPSY      No Known Allergies  Family History  Problem Relation Age of Onset  . Cancer Father 51       prost ca     Social History   Socioeconomic History  . Marital status: Married    Spouse name: Not on file  . Number of children: Not on file  .  Years of education: Not on file  . Highest education level: Not on file  Social Needs  . Financial resource strain: Not on file  . Food insecurity - worry: Not on file  . Food insecurity - inability: Not on file  . Transportation needs - medical: Not on file  . Transportation needs - non-medical: Not on file  Occupational History  . Not on file  Tobacco Use  . Smoking status: Never Smoker  . Smokeless tobacco: Never Used  Substance and Sexual Activity  . Alcohol use: No  . Drug use: No  . Sexual activity: Yes  Other Topics Concern  . Not on file  Social History Narrative  . Not on file     PHYSICAL EXAM:  VS: BP 136/78 (BP Location: Left Arm, Patient Position: Sitting, Cuff Size: Normal)   Pulse 84   Temp 97.9 F (36.6 C) (Oral)   Ht 5\' 4"  (1.626 m)   Wt 180 lb (81.6 kg)   SpO2 98%   BMI 30.90 kg/m  Physical Exam Gen: NAD, alert, cooperative with exam, well-appearing ENT: normal lips, normal nasal mucosa,  Eye: normal EOM, normal conjunctiva and lids CV:  no edema, +2 pedal pulses   Resp: no accessory muscle use, non-labored,  Skin: no rashes, no areas of induration  Neuro: normal tone, normal sensation to touch Psych:  normal insight, alert and oriented MSK:  Back: Tender to palpation of the paraspinal muscles No tenderness to  palpation of the lumbar spine, greater trochanter or piriformis Pain with straight leg raise bilaterally. Normal internal rotation of the hips. Normal strength resistance with hip flexion bilaterally. Normal knee flexion and extension. Walking upright and stiff in nature. Neurovascularly intact     ASSESSMENT & PLAN:   LOW BACK PAIN This appears to be muscular in nature. He does have pain with straight leg testing but does not appear to be sciatica. - X-rays - IM depo - Meloxicam and Flexeril - Counseled on home exercises. - If no improvement consider physical therapy

## 2017-11-08 NOTE — Patient Instructions (Signed)
We will call you with the results from today  The muscle relaxer can make you drowsy so take 1 and see how you do. You can build up to three a day as you tolerate  Please take the anti inflammatory (mobic) in about 5 days as needed.  Please the exercises when your pain is about 2/10.  If you pain doesn't improve then please let me know. We can try physical therapy.

## 2017-11-11 ENCOUNTER — Telehealth: Payer: Self-pay | Admitting: Family Medicine

## 2017-11-11 NOTE — Telephone Encounter (Signed)
Informed patient of results.   Rosemarie Ax, MD [facility] 11/11/2017, 8:31 AM

## 2017-11-26 DIAGNOSIS — M5136 Other intervertebral disc degeneration, lumbar region: Secondary | ICD-10-CM | POA: Diagnosis not present

## 2017-12-19 ENCOUNTER — Other Ambulatory Visit: Payer: Self-pay | Admitting: *Deleted

## 2017-12-19 ENCOUNTER — Telehealth: Payer: Self-pay | Admitting: Internal Medicine

## 2017-12-19 MED ORDER — SIMVASTATIN 40 MG PO TABS: 40.0000 mg | ORAL_TABLET | Freq: Every day | ORAL | 0 refills | Status: DC

## 2017-12-19 NOTE — Telephone Encounter (Signed)
Rx sent for Zocor #30 [id number]  Per protocol

## 2017-12-19 NOTE — Telephone Encounter (Signed)
Copied from [place]. Topic: Quick Communication - See Telephone Encounter >> Dec 19, 2017  3:07 PM Bea Graff, NT wrote: CRM for notification. See Telephone encounter for: Pt requesting a refill of simvastatin (ZOCOR). [facility] [address]   12/19/17.

## 2017-12-31 DIAGNOSIS — F3341 Major depressive disorder, recurrent, in partial remission: Secondary | ICD-10-CM | POA: Diagnosis not present

## 2018-01-06 ENCOUNTER — Ambulatory Visit (INDEPENDENT_AMBULATORY_CARE_PROVIDER_SITE_OTHER): Payer: BLUE CROSS/BLUE SHIELD | Admitting: Internal Medicine

## 2018-01-06 ENCOUNTER — Encounter: Payer: Self-pay | Admitting: Internal Medicine

## 2018-01-06 ENCOUNTER — Other Ambulatory Visit (INDEPENDENT_AMBULATORY_CARE_PROVIDER_SITE_OTHER): Payer: BLUE CROSS/BLUE SHIELD

## 2018-01-06 ENCOUNTER — Other Ambulatory Visit: Payer: BLUE CROSS/BLUE SHIELD

## 2018-01-06 VITALS — BP 120/76 | HR 62 | Temp 98.1°F | Ht 64.0 in | Wt 177.0 lb

## 2018-01-06 DIAGNOSIS — Z0000 Encounter for general adult medical examination without abnormal findings: Secondary | ICD-10-CM

## 2018-01-06 DIAGNOSIS — M545 Low back pain, unspecified: Secondary | ICD-10-CM

## 2018-01-06 DIAGNOSIS — I10 Essential (primary) hypertension: Secondary | ICD-10-CM

## 2018-01-06 DIAGNOSIS — E785 Hyperlipidemia, unspecified: Secondary | ICD-10-CM

## 2018-01-06 LAB — CBC WITH DIFFERENTIAL/PLATELET
Basophils Absolute: 0 10*3/uL (ref 0.0–0.1)
Basophils Relative: 0.6 % (ref 0.0–3.0)
EOS ABS: 0.2 10*3/uL (ref 0.0–0.7)
EOS PCT: 2.8 % (ref 0.0–5.0)
HCT: 46.1 % (ref 39.0–52.0)
Hemoglobin: 15.6 g/dL (ref 13.0–17.0)
Lymphocytes Relative: 21.9 % (ref 12.0–46.0)
Lymphs Abs: 1.5 10*3/uL (ref 0.7–4.0)
MCHC: 33.9 g/dL (ref 30.0–36.0)
MCV: 87.8 fl (ref 78.0–100.0)
MONO ABS: 0.6 10*3/uL (ref 0.1–1.0)
MONOS PCT: 9.3 % (ref 3.0–12.0)
NEUTROS PCT: 65.4 % (ref 43.0–77.0)
Neutro Abs: 4.5 10*3/uL (ref 1.4–7.7)
PLATELETS: 234 10*3/uL (ref 150.0–400.0)
RBC: 5.26 Mil/uL (ref 4.22–5.81)
RDW: 13.3 % (ref 11.5–15.5)
WBC: 6.8 10*3/uL (ref 4.0–10.5)

## 2018-01-06 LAB — URINALYSIS, ROUTINE W REFLEX MICROSCOPIC
Bilirubin Urine: NEGATIVE
Ketones, ur: NEGATIVE
Leukocytes, UA: NEGATIVE
Nitrite: NEGATIVE
PH: 6 (ref 5.0–8.0)
RBC / HPF: NONE SEEN (ref 0–?)
Specific Gravity, Urine: 1.025 (ref 1.000–1.030)
TOTAL PROTEIN, URINE-UPE24: NEGATIVE
Urine Glucose: NEGATIVE
Urobilinogen, UA: 0.2 (ref 0.0–1.0)

## 2018-01-06 LAB — TSH: TSH: 1.13 u[IU]/mL (ref 0.35–4.50)

## 2018-01-06 LAB — LIPID PANEL
CHOL/HDL RATIO: 3
Cholesterol: 188 mg/dL (ref 0–200)
HDL: 66.1 mg/dL (ref >39.00)
LDL Cholesterol: 109 mg/dL — ABNORMAL HIGH (ref 0–99)
NONHDL: 122.11
TRIGLYCERIDES: 65 mg/dL (ref 0.0–149.0)
VLDL: 13 mg/dL (ref 0.0–40.0)

## 2018-01-06 LAB — BASIC METABOLIC PANEL
BUN: 19 mg/dL (ref 6–23)
CALCIUM: 9.3 mg/dL (ref 8.4–10.5)
CO2: 30 meq/L (ref 19–32)
CREATININE: 1.08 mg/dL (ref 0.40–1.50)
Chloride: 102 mEq/L (ref 96–112)
GFR: 75.08 mL/min (ref >60.00)
GLUCOSE: 107 mg/dL — AB (ref 70–99)
Potassium: 4.3 mEq/L (ref 3.5–5.1)
SODIUM: 139 meq/L (ref 135–145)

## 2018-01-06 LAB — HEPATIC FUNCTION PANEL
ALBUMIN: 4.4 g/dL (ref 3.5–5.2)
ALK PHOS: 62 U/L (ref 39–117)
ALT: 21 U/L (ref 0–53)
AST: 21 U/L (ref 0–37)
Bilirubin, Direct: 0.1 mg/dL (ref 0.0–0.3)
Total Bilirubin: 0.6 mg/dL (ref 0.2–1.2)
Total Protein: 7.2 g/dL (ref 6.0–8.3)

## 2018-01-06 MED ORDER — MUPIROCIN 2 % EX OINT: TOPICAL_OINTMENT | CUTANEOUS | 0 refills | Status: DC

## 2018-01-06 MED ORDER — SIMVASTATIN 40 MG PO TABS: 40.0000 mg | ORAL_TABLET | Freq: Every day | ORAL | 3 refills | Status: DC

## 2018-01-06 NOTE — Progress Notes (Signed)
Subjective:  [name], male    DOB: 1961/09/02  Age: 57 y.o. MRN: [id number]  CC: No chief complaint on file.   HPI Kenneth Orozco presents for a well exam  C/o LBP - resolved C/o R knee pain - off and on  Outpatient Medications Prior to Visit  Medication Sig Dispense Refill  . Cholecalciferol (VITAMIN D3) 2000 UNITS capsule Take 1 capsule (2,000 Units total) by mouth daily. 100 capsule 3  . sertraline (ZOLOFT) 100 MG tablet Take 1.5 tablets (150 mg total) by mouth daily. 135 tablet 3  . traMADol (ULTRAM) 50 MG tablet Take 1 tablet (50 mg total) by mouth every 8 (eight) hours as needed. 15 tablet 0  . simvastatin (ZOCOR) 40 MG tablet Take 1 tablet (40 mg total) by mouth at bedtime. 30 tablet 0  . cyclobenzaprine (FLEXERIL) 10 MG tablet Take 1 tablet (10 mg total) by mouth 3 (three) times daily as needed for muscle spasms. 30 tablet 1  . meloxicam (MOBIC) 15 MG tablet Take 1 tablet (15 mg total) by mouth daily. 30 tablet 0   No facility-administered medications prior to visit.     ROS Review of Systems  Constitutional: Negative for appetite change, fatigue and unexpected weight change.  HENT: Negative for congestion, nosebleeds, sneezing, sore throat and trouble swallowing.   Eyes: Negative for itching and visual disturbance.  Respiratory: Negative for cough.   Cardiovascular: Negative for chest pain, palpitations and leg swelling.  Gastrointestinal: Negative for abdominal distention, blood in stool, diarrhea and nausea.  Genitourinary: Negative for frequency and hematuria.  Musculoskeletal: Negative for back pain, gait problem, joint swelling and neck pain.  Skin: Negative for rash.  Neurological: Negative for dizziness, tremors, speech difficulty and weakness.  Psychiatric/Behavioral: Negative for agitation, dysphoric mood and sleep disturbance. The patient is not nervous/anxious.     Objective:  BP 120/76 (BP Location: Left Arm, Patient Position: Sitting, Cuff  Size: Large)   Pulse 62   Temp 98.1 F (36.7 C) (Oral)   Ht 5\' 4"  (1.626 m)   Wt 177 lb (80.3 kg)   SpO2 99%   BMI 30.38 kg/m   BP Readings from Last 3 Encounters:  01/06/18 120/76  11/08/17 136/78  10/04/16 122/68    Wt Readings from Last 3 Encounters:  01/06/18 177 lb (80.3 kg)  11/08/17 180 lb (81.6 kg)  10/04/16 187 lb (84.8 kg)    Physical Exam  Constitutional: He is oriented to person, place, and time. He appears well-developed. No distress.  NAD  HENT:  Mouth/Throat: Oropharynx is clear and moist.  Eyes: Conjunctivae are normal. Pupils are equal, round, and reactive to light.  Neck: Normal range of motion. No JVD present. No thyromegaly present.  Cardiovascular: Normal rate, regular rhythm, normal heart sounds and intact distal pulses. Exam reveals no gallop and no friction rub.  No murmur heard. Pulmonary/Chest: Effort normal and breath sounds normal. No respiratory distress. He has no wheezes. He has no rales. He exhibits no tenderness.  Abdominal: Soft. Bowel sounds are normal. He exhibits no distension and no mass. There is no tenderness. There is no rebound and no guarding.  Musculoskeletal: Normal range of motion. He exhibits no edema or tenderness.  Lymphadenopathy:    He has no cervical adenopathy.  Neurological: He is alert and oriented to person, place, and time. He has normal reflexes. No cranial nerve deficit. He exhibits normal muscle tone. He displays a negative Romberg sign. Coordination and gait normal.  Skin:  Skin is warm and dry. No rash noted.  Psychiatric: He has a normal mood and affect. His behavior is normal. Judgment and thought content normal.  rectal - declined; per Urology  Lab Results  Component Value Date   WBC 6.2 10/04/2016   HGB 15.4 10/04/2016   HCT 45.4 10/04/2016   PLT 247.0 10/04/2016   GLUCOSE 100 (H) 10/04/2016   CHOL 214 (H) 10/04/2016   TRIG 131.0 10/04/2016   HDL 62.60 10/04/2016   LDLDIRECT 145.6 08/01/2012   LDLCALC  125 (H) 10/04/2016   ALT 18 10/04/2016   AST 19 10/04/2016   NA 139 10/04/2016   K 4.5 10/04/2016   CL 104 10/04/2016   CREATININE 1.01 10/04/2016   BUN 18 10/04/2016   CO2 29 10/04/2016   TSH 1.13 10/04/2016   PSA 2.61 08/25/2010    Dg Lumbar Spine 2-3 Views  Result Date: 10/15/2011 *RADIOLOGY REPORT* Clinical Data: Low back pain, recently lifted heavy couch LUMBAR SPINE - 2-3 VIEW Comparison: None. Findings: The lumbar vertebrae are straightened in alignment. Intervertebral disc spaces appear normal.  No compression deformity is seen.  The SI joints appear normal. IMPRESSION: Straightened alignment.  Normal intervertebral disc spaces. Original Report Authenticated By: Joretta Bachelor, M.D.   Assessment & Plan:   There are no diagnoses linked to this encounter. I have discontinued Kenneth Orozco's meloxicam and cyclobenzaprine. I am also having him maintain his sertraline, Vitamin D3, traMADol, and simvastatin.  Meds ordered this encounter  Medications  . simvastatin (ZOCOR) 40 MG tablet    Sig: Take 1 tablet (40 mg total) by mouth at bedtime.    Dispense:  90 tablet    Refill:  3     Follow-up: No Follow-up on file.  Walker Kehr, MD

## 2018-01-06 NOTE — Assessment & Plan Note (Signed)
BP Readings from Last 3 Encounters:  01/06/18 120/76  11/08/17 136/78  10/04/16 122/68

## 2018-01-06 NOTE — Assessment & Plan Note (Signed)
- 

## 2018-01-06 NOTE — Assessment & Plan Note (Addendum)
MSK Dr Raeford Razor, Dr Lynann Bologna 2019

## 2018-01-06 NOTE — Assessment & Plan Note (Signed)
We discussed age appropriate health related issues, including available/recomended screening tests and vaccinations. We discussed a need for adhering to healthy diet and exercise. Labs were reviewed/ordered. All questions were answered.   Dr Collene Mares said colon is due in 2022. PSA per Dr Diona Fanti

## 2018-01-15 DIAGNOSIS — R972 Elevated prostate specific antigen [PSA]: Secondary | ICD-10-CM | POA: Diagnosis not present

## 2018-01-16 ENCOUNTER — Other Ambulatory Visit: Payer: Self-pay | Admitting: Internal Medicine

## 2018-02-05 ENCOUNTER — Ambulatory Visit (INDEPENDENT_AMBULATORY_CARE_PROVIDER_SITE_OTHER): Payer: BLUE CROSS/BLUE SHIELD | Admitting: Orthopaedic Surgery

## 2018-02-05 ENCOUNTER — Encounter (INDEPENDENT_AMBULATORY_CARE_PROVIDER_SITE_OTHER): Payer: Self-pay | Admitting: Orthopaedic Surgery

## 2018-02-05 ENCOUNTER — Ambulatory Visit (INDEPENDENT_AMBULATORY_CARE_PROVIDER_SITE_OTHER): Payer: Self-pay

## 2018-02-05 DIAGNOSIS — M25561 Pain in right knee: Secondary | ICD-10-CM | POA: Diagnosis not present

## 2018-02-05 DIAGNOSIS — G8929 Other chronic pain: Secondary | ICD-10-CM | POA: Diagnosis not present

## 2018-02-05 MED ORDER — LIDOCAINE HCL 1 % IJ SOLN
2.0000 mL | INTRAMUSCULAR | Status: AC | PRN
  Administered 2018-02-05: 2 mL

## 2018-02-05 MED ORDER — METHYLPREDNISOLONE ACETATE 40 MG/ML IJ SUSP
40.0000 mg | INTRAMUSCULAR | Status: AC | PRN
  Administered 2018-02-05: 40 mg via INTRA_ARTICULAR

## 2018-02-05 MED ORDER — BUPIVACAINE HCL 0.5 % IJ SOLN
2.0000 mL | INTRAMUSCULAR | Status: AC | PRN
  Administered 2018-02-05: 2 mL via INTRA_ARTICULAR

## 2018-02-05 NOTE — Progress Notes (Signed)
Office Visit Note   Patient: Laval Cafaro           Date of Birth: 1961-08-31           MRN: [id number] Visit Date: 02/05/2018              Requested by: Cassandria Anger, MD [place], [place] [address] PCP: Cassandria Anger, MD   Assessment & Plan: Visit Diagnoses:  1. Chronic pain of right knee     Plan: Impression is 57 year old male with right knee pain degenerative MMT vs OA and effusion.  45 cc of blood-tinged fluid was aspirated and then injected with cortisone.  We will notify the patient of the fluid results.  If the effusion re-accumulates then we will need to obtain an MRI.  Follow-Up Instructions: Return if symptoms worsen or fail to improve.   Orders:  Orders Placed This Encounter  Procedures  . XR KNEE 3 VIEW RIGHT   No orders of the defined types were placed in this encounter.     Procedures: Large Joint Inj: R knee on 02/05/2018 4:25 PM Indications: pain Details: 22 G needle  Arthrogram: No  Medications: 40 mg methylPREDNISolone acetate 40 MG/ML; 2 mL lidocaine 1 %; 2 mL bupivacaine 0.5 % Aspirate: 45 mL blood-tinged; sent for lab analysis Outcome: tolerated well, no immediate complications Consent was given by the patient. Patient was prepped and draped in the usual sterile fashion.       Clinical Data: No additional findings.   Subjective: Chief Complaint  Patient presents with  . Right Knee - Pain    HPI Caison is a pleasant 57 year old gentleman who is a new patient to our office.  He presents today with right knee pain.  This began approximately 1 year ago without an injury or change in activity.  He was seen by [place] doctor at the time who injected him with cortisone.  This significantly helped for 9 to 10 months.  His pain returned this past Friday without an injury.  He does note that he typically runs 2 miles a day and ran 3 miles the day leading up to the increased pain.  All this pain is medial aspect.  He describes this  is an ache which causes him to limp if he has been up on his feet walking or running.  No locking catching or instability.  He does get improvement symptoms with rest.  He has not taken any medications for this.  No numbness tingling burning.  No previous surgical intervention to the right knee.  Review of Systems as detailed in HPI.  All are reviewed and are negative.   Objective: Vital Signs: There were no vitals taken for this visit.  Physical Exam well-developed well-nourished gentleman no acute distress.  Alert and oriented x3.  Ortho Exam examination of his right knee reveals 1+ effusion.  Range of motion 0 to 125 degrees.  No joint line tenderness.  No patellofemoral crepitus.  He has stable valgus and varus stress.  He is intact distally.  Specialty Comments:  No specialty comments available.  Imaging: Xr Knee 3 View Right  Result Date: 02/05/2018 No acute or structural abnormalities.  No significant degenerative joint disease or joint space narrowing    PMFS History: Patient Active Problem List   Diagnosis Date Noted  . Sprain of ankle, right 05/07/2014  . Well adult exam 05/07/2012  . Pruritus ani 07/30/2011  . Essential hypertension 01/02/2011  . HEMORRHOIDS 01/02/2011  .  HEADACHE 01/02/2011  . INGUINAL PAIN, RIGHT 09/14/2009  . LOW BACK PAIN 04/05/2009  . ELEVATED PROSTATE SPECIFIC ANTIGEN 04/05/2009  . UPPER RESPIRATORY INFECTION (URI) 08/24/2008  . LIPOMA 07/18/2007  . Dyslipidemia 07/18/2007  . DEPRESSION 07/18/2007   Past Medical History:  Diagnosis Date  . Depression   . Hyperlipidemia   . Hypertension   . Rectal bleeding     Family History  Problem Relation Age of Onset  . Cancer Father 71       prost ca    Past Surgical History:  Procedure Laterality Date  . ANAL FISSURE REPAIR    . PROSTATE BIOPSY     Social History   Occupational History  . Not on file  Tobacco Use  . Smoking status: Never Smoker  . Smokeless tobacco: Never Used    Substance and Sexual Activity  . Alcohol use: No  . Drug use: No  . Sexual activity: Yes

## 2018-02-05 NOTE — Addendum Note (Signed)
Addended by: Precious Bard on: 02/05/2018 04:35 PM   Modules accepted: Orders

## 2018-02-06 LAB — SYNOVIAL CELL COUNT + DIFF, W/ CRYSTALS
BASOPHILS, %: 0 %
EOSINOPHILS-SYNOVIAL: 1 % (ref 0–2)
Lymphocytes-Synovial Fld: 41 % (ref 0–74)
Monocyte/Macrophage: 45 % (ref 0–69)
NEUTROPHIL, SYNOVIAL: 13 % (ref 0–24)
SYNOVIOCYTES, %: 0 % (ref 0–15)
WBC, Synovial: 241 cells/uL — ABNORMAL HIGH (ref <150)

## 2018-02-06 LAB — TIQ-NTM

## 2018-02-06 NOTE — Progress Notes (Signed)
Shows inflammatory.  Nothing unexpected.  Please let patient know.  Thanks.

## 2018-03-12 ENCOUNTER — Ambulatory Visit: Payer: BLUE CROSS/BLUE SHIELD

## 2018-03-14 ENCOUNTER — Telehealth: Payer: Self-pay

## 2018-03-14 ENCOUNTER — Ambulatory Visit (INDEPENDENT_AMBULATORY_CARE_PROVIDER_SITE_OTHER): Payer: BLUE CROSS/BLUE SHIELD

## 2018-03-14 DIAGNOSIS — Z299 Encounter for prophylactic measures, unspecified: Secondary | ICD-10-CM

## 2018-03-14 NOTE — Telephone Encounter (Signed)
Patient has been contacted about shingrix waitlist---both vaccines labeled, appt has been scheduled

## 2018-04-23 ENCOUNTER — Telehealth (INDEPENDENT_AMBULATORY_CARE_PROVIDER_SITE_OTHER): Payer: Self-pay | Admitting: Orthopaedic Surgery

## 2018-04-23 NOTE — Telephone Encounter (Signed)
Patient called, left voice mail triage that he wants to be worked in for elbow pain tomorrow or Friday.  He is leaving to go out of town next week.  Please call him for appt.

## 2018-04-23 NOTE — Telephone Encounter (Signed)
SEE MESSAGE BELOW:

## 2018-04-23 NOTE — Telephone Encounter (Signed)
sure

## 2018-04-23 NOTE — Telephone Encounter (Signed)
Pt called again would like a appt this week

## 2018-04-23 NOTE — Telephone Encounter (Signed)
appt made

## 2018-04-24 ENCOUNTER — Ambulatory Visit (INDEPENDENT_AMBULATORY_CARE_PROVIDER_SITE_OTHER): Payer: BLUE CROSS/BLUE SHIELD

## 2018-04-24 ENCOUNTER — Ambulatory Visit (INDEPENDENT_AMBULATORY_CARE_PROVIDER_SITE_OTHER): Payer: BLUE CROSS/BLUE SHIELD | Admitting: Orthopaedic Surgery

## 2018-04-24 DIAGNOSIS — M25522 Pain in left elbow: Secondary | ICD-10-CM

## 2018-04-24 MED ORDER — METHYLPREDNISOLONE 4 MG PO TBPK: ORAL_TABLET | ORAL | 0 refills | Status: DC

## 2018-04-24 NOTE — Progress Notes (Signed)
Office Visit Note   Patient: Kenneth Orozco           Date of Birth: 03/14/1961           MRN: [id number] Visit Date: 04/24/2018              Requested by: Cassandria Anger, MD [place], [place] [address] PCP: Cassandria Anger, MD   Assessment & Plan: Visit Diagnoses:  1. Pain in left elbow     Plan: Impression is left olecranon bursitis.  Suspicion for infection is low although I did discuss with patient to look out for worrisome signs and symptoms.  I am going to put him on Medrol Dosepak.  Ace  compression wrap was applied today.  Ice around-the-clock.  Patient needs to be mindful of where he rests his elbow.  Follow-up as needed.  Follow-Up Instructions: Return if symptoms worsen or fail to improve.   Orders:  Orders Placed This Encounter  Procedures  . XR Elbow 2 Views Left   Meds ordered this encounter  Medications  . methylPREDNISolone (MEDROL DOSEPAK) 4 MG TBPK tablet    Sig: Use as directed    Dispense:  21 tablet    Refill:  0      Procedures: No procedures performed   Clinical Data: No additional findings.   Subjective: Chief Complaint  Patient presents with  . Left Elbow - Pain, Edema    Kenneth Orozco is a 57 year old gentleman who comes in with 1-1/2-week of left elbow swelling and warmth.  He denies any constitutional symptoms.  He does not have any significant pain.  He has full range of motion of his elbow.  Denies any injuries.   Review of Systems  Constitutional: Negative.   All other systems reviewed and are negative.    Objective: Vital Signs: There were no vitals taken for this visit.  Physical Exam  Constitutional: He is oriented to person, place, and time. He appears well-developed and well-nourished.  Pulmonary/Chest: Effort normal.  Abdominal: Soft.  Neurological: He is alert and oriented to person, place, and time.  Skin: Skin is warm.  Psychiatric: He has a normal mood and affect. His behavior is normal. Judgment and  thought content normal.  Nursing note and vitals reviewed.   Ortho Exam Left elbow exam shows full range of motion that is painless.  He has slight warmth in the elbow and proximal ulnar region.  He does have a mild olecranon bursitis.  There is no cellulitis.  There is no significant tenderness.  There is some swelling of the proximal ulna.  Specialty Comments:  No specialty comments available.  Imaging: Xr Elbow 2 Views Left  Result Date: 04/24/2018 No acute or structural abnormalities.  Mild soft tissue swelling overlying the proximal ulna.    PMFS History: Patient Active Problem List   Diagnosis Date Noted  . Sprain of ankle, right 05/07/2014  . Well adult exam 05/07/2012  . Pruritus ani 07/30/2011  . Essential hypertension 01/02/2011  . HEMORRHOIDS 01/02/2011  . HEADACHE 01/02/2011  . INGUINAL PAIN, RIGHT 09/14/2009  . LOW BACK PAIN 04/05/2009  . ELEVATED PROSTATE SPECIFIC ANTIGEN 04/05/2009  . UPPER RESPIRATORY INFECTION (URI) 08/24/2008  . LIPOMA 07/18/2007  . Dyslipidemia 07/18/2007  . DEPRESSION 07/18/2007   Past Medical History:  Diagnosis Date  . Depression   . Hyperlipidemia   . Hypertension   . Rectal bleeding     Family History  Problem Relation Age of Onset  .  Cancer Father 70       prost ca    Past Surgical History:  Procedure Laterality Date  . ANAL FISSURE REPAIR    . PROSTATE BIOPSY     Social History   Occupational History  . Not on file  Tobacco Use  . Smoking status: Never Smoker  . Smokeless tobacco: Never Used  Substance and Sexual Activity  . Alcohol use: No  . Drug use: No  . Sexual activity: Yes

## 2018-04-25 ENCOUNTER — Other Ambulatory Visit (INDEPENDENT_AMBULATORY_CARE_PROVIDER_SITE_OTHER): Payer: Self-pay

## 2018-04-25 ENCOUNTER — Telehealth (INDEPENDENT_AMBULATORY_CARE_PROVIDER_SITE_OTHER): Payer: Self-pay | Admitting: Orthopaedic Surgery

## 2018-04-25 MED ORDER — METHYLPREDNISOLONE 4 MG PO TBPK: ORAL_TABLET | ORAL | 0 refills | Status: DC

## 2018-04-25 NOTE — Telephone Encounter (Signed)
Called patient advised [facility] [address]

## 2018-04-25 NOTE — Telephone Encounter (Signed)
Refaxed, can you let patient know please. Thank you.

## 2018-04-25 NOTE — Telephone Encounter (Signed)
Patient called advised the Rx for (Medrol) was not received at the pharmacy[facility] [address]. The number to contact patient is [phone]

## 2018-05-30 ENCOUNTER — Ambulatory Visit (INDEPENDENT_AMBULATORY_CARE_PROVIDER_SITE_OTHER): Payer: BLUE CROSS/BLUE SHIELD

## 2018-05-30 DIAGNOSIS — Z299 Encounter for prophylactic measures, unspecified: Secondary | ICD-10-CM

## 2018-06-05 ENCOUNTER — Ambulatory Visit (INDEPENDENT_AMBULATORY_CARE_PROVIDER_SITE_OTHER): Payer: BLUE CROSS/BLUE SHIELD | Admitting: Orthopaedic Surgery

## 2018-06-05 ENCOUNTER — Encounter (INDEPENDENT_AMBULATORY_CARE_PROVIDER_SITE_OTHER): Payer: Self-pay | Admitting: Orthopaedic Surgery

## 2018-06-05 VITALS — Ht 64.0 in | Wt 177.0 lb

## 2018-06-05 DIAGNOSIS — M7022 Olecranon bursitis, left elbow: Secondary | ICD-10-CM

## 2018-06-05 MED ORDER — METHYLPREDNISOLONE ACETATE 40 MG/ML IJ SUSP
40.0000 mg | INTRAMUSCULAR | Status: AC | PRN
  Administered 2018-06-05: 40 mg via INTRA_ARTICULAR

## 2018-06-05 MED ORDER — LIDOCAINE HCL 1 % IJ SOLN
1.0000 mL | INTRAMUSCULAR | Status: AC | PRN
  Administered 2018-06-05: 1 mL

## 2018-06-05 MED ORDER — BUPIVACAINE HCL 0.5 % IJ SOLN
1.0000 mL | INTRAMUSCULAR | Status: AC | PRN
  Administered 2018-06-05: 1 mL via INTRA_ARTICULAR

## 2018-06-05 NOTE — Addendum Note (Signed)
Addended by: Pamella Pert on: 06/05/2018 09:57 AM   Modules accepted: Orders

## 2018-06-05 NOTE — Progress Notes (Signed)
   Office Visit Note   Patient: Kenneth Orozco           Date of Birth: 08/27/1961           MRN: [id number] Visit Date: 06/05/2018              Requested by: Cassandria Anger, MD [place], [place] [address] PCP: Cassandria Anger, MD   Assessment & Plan: Visit Diagnoses:  1. Olecranon bursitis of left elbow     Plan: Impression is persistent left olecranon bursitis.  Aspiration injection was performed today under sterile conditions.  Patient tolerates well.  This yielded approximately 12 cc of blood-tinged fluid.  This was sent off to the lab.  Continue with warm compresses and symptomatic management.  Patient in agreement.  Follow-Up Instructions: Return if symptoms worsen or fail to improve.   Orders:  No orders of the defined types were placed in this encounter.  No orders of the defined types were placed in this encounter.     Procedures: Medium Joint Inj: L olecranon bursa on 06/05/2018 8:43 AM Indications: pain Details: posterior approach Medications: 1 mL lidocaine 1 %; 1 mL bupivacaine 0.5 %; 40 mg methylPREDNISolone acetate 40 MG/ML Aspirate: 12 mL blood-tinged; sent for lab analysis Outcome: tolerated well, no immediate complications Patient was prepped and draped in the usual sterile fashion.       Clinical Data: No additional findings.   Subjective: Chief Complaint  Patient presents with  . Left Elbow - Pain    Patient comes back for tingling bursitis.  Denies any constitutional symptoms or any worsening the bursitis is stable.  This is bothering him and he would like to have aspirated.   Review of Systems   Objective: Vital Signs: Ht 5\' 4"  (1.626 m)   Wt 177 lb (80.3 kg)   BMI 30.38 kg/m   Physical Exam  Ortho Exam Left elbow exam shows fluctuant olecranon bursitis without any evidence of infection or cellulitis.  There is no drainage.  Full range of motion of the elbow. Specialty Comments:  No specialty comments  available.  Imaging: No results found.   PMFS History: Patient Active Problem List   Diagnosis Date Noted  . Sprain of ankle, right 05/07/2014  . Well adult exam 05/07/2012  . Pruritus ani 07/30/2011  . Essential hypertension 01/02/2011  . HEMORRHOIDS 01/02/2011  . HEADACHE 01/02/2011  . INGUINAL PAIN, RIGHT 09/14/2009  . LOW BACK PAIN 04/05/2009  . ELEVATED PROSTATE SPECIFIC ANTIGEN 04/05/2009  . UPPER RESPIRATORY INFECTION (URI) 08/24/2008  . LIPOMA 07/18/2007  . Dyslipidemia 07/18/2007  . DEPRESSION 07/18/2007   Past Medical History:  Diagnosis Date  . Depression   . Hyperlipidemia   . Hypertension   . Rectal bleeding     Family History  Problem Relation Age of Onset  . Cancer Father 42       prost ca    Past Surgical History:  Procedure Laterality Date  . ANAL FISSURE REPAIR    . PROSTATE BIOPSY     Social History   Occupational History  . Not on file  Tobacco Use  . Smoking status: Never Smoker  . Smokeless tobacco: Never Used  Substance and Sexual Activity  . Alcohol use: No  . Drug use: No  . Sexual activity: Yes

## 2018-06-06 ENCOUNTER — Telehealth (INDEPENDENT_AMBULATORY_CARE_PROVIDER_SITE_OTHER): Payer: Self-pay

## 2018-06-06 LAB — SYNOVIAL CELL COUNT + DIFF, W/ CRYSTALS
Basophils, %: 0 %
Eosinophils-Synovial: 1 % (ref 0–2)
LYMPHOCYTES-SYNOVIAL FLD: 38 % (ref 0–74)
Monocyte/Macrophage: 48 % (ref 0–69)
NEUTROPHIL, SYNOVIAL: 13 % (ref 0–24)
Synoviocytes, %: 0 % (ref 0–15)
WBC, Synovial: 686 cells/uL — ABNORMAL HIGH (ref <150)

## 2018-06-06 LAB — TIQ-MISC

## 2018-06-06 NOTE — Telephone Encounter (Signed)
IC and confirmed left elbow is source for fluid sent in.

## 2018-06-06 NOTE — Telephone Encounter (Signed)
They need a call back for clarification on a lab order Specimen #[id number] G When calling back is opt 1, opt 3, and then opt 1 again

## 2018-06-06 NOTE — Progress Notes (Signed)
Just inflammatory fluid.  No gout or infection

## 2018-06-17 DIAGNOSIS — F3341 Major depressive disorder, recurrent, in partial remission: Secondary | ICD-10-CM | POA: Diagnosis not present

## 2018-07-30 DIAGNOSIS — R972 Elevated prostate specific antigen [PSA]: Secondary | ICD-10-CM | POA: Diagnosis not present

## 2018-07-30 DIAGNOSIS — N400 Enlarged prostate without lower urinary tract symptoms: Secondary | ICD-10-CM | POA: Diagnosis not present

## 2018-09-05 ENCOUNTER — Encounter (INDEPENDENT_AMBULATORY_CARE_PROVIDER_SITE_OTHER): Payer: Self-pay | Admitting: Orthopaedic Surgery

## 2018-09-05 ENCOUNTER — Ambulatory Visit (INDEPENDENT_AMBULATORY_CARE_PROVIDER_SITE_OTHER): Payer: BLUE CROSS/BLUE SHIELD | Admitting: Orthopaedic Surgery

## 2018-09-05 DIAGNOSIS — M7022 Olecranon bursitis, left elbow: Secondary | ICD-10-CM | POA: Diagnosis not present

## 2018-09-05 MED ORDER — BUPIVACAINE HCL 0.5 % IJ SOLN
1.0000 mL | INTRAMUSCULAR | Status: AC | PRN
  Administered 2018-09-05: 1 mL via INTRA_ARTICULAR

## 2018-09-05 MED ORDER — LIDOCAINE HCL 1 % IJ SOLN
1.0000 mL | INTRAMUSCULAR | Status: AC | PRN
  Administered 2018-09-05: 1 mL

## 2018-09-05 MED ORDER — METHYLPREDNISOLONE ACETATE 40 MG/ML IJ SUSP
40.0000 mg | INTRAMUSCULAR | Status: AC | PRN
  Administered 2018-09-05: 40 mg via INTRA_ARTICULAR

## 2018-09-05 NOTE — Progress Notes (Signed)
   Office Visit Note   Patient: Kenneth Orozco           Date of Birth: 1961/02/08           MRN: [id number] Visit Date: 09/05/2018              Requested by: Cassandria Anger, MD [place], [place] [address] PCP: Cassandria Anger, MD   Assessment & Plan: Visit Diagnoses:  1. 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  Aspiration and injection was again performed with return of bloody bursal fluid.  Compression wrap was applied.  Warm compresses as needed.  Questions encouraged and answered.  Follow-up as needed.  Follow-Up Instructions: Return if symptoms worsen or fail to improve.   Orders:  No orders of the defined types were placed in this encounter.  No orders of the defined types were placed in this encounter.     Procedures: Medium Joint Inj: L olecranon bursa on 09/05/2018 8:43 AM Details: 22 G needle Medications: 1 mL bupivacaine 0.5 %; 1 mL lidocaine 1 %; 40 mg methylPREDNISolone acetate 40 MG/ML Aspirate: 15 mL bloody Outcome: tolerated well, no immediate complications      Clinical Data: No additional findings.   Subjective: Chief Complaint  Patient presents with  . Left Elbow - Pain    [facility]on follows up today for recurrent olecranon bursitis of his left elbow.  We performed aspiration and injection in July which helped until last week.  He denies any constitutional symptoms.   Review of Systems   Objective: Vital Signs: There were no vitals taken for this visit.  Physical Exam  Ortho Exam Left elbow exam shows a fluctuant olecranon bursa without any evidence of infection.  This is minimally tender. Specialty Comments:  No specialty comments available.  Imaging: No results found.   PMFS History: Patient Active Problem List   Diagnosis Date Noted  . Sprain of ankle, right 05/07/2014  . Well adult exam 05/07/2012  . Pruritus ani 07/30/2011  . Essential  hypertension 01/02/2011  . HEMORRHOIDS 01/02/2011  . HEADACHE 01/02/2011  . INGUINAL PAIN, RIGHT 09/14/2009  . LOW BACK PAIN 04/05/2009  . ELEVATED PROSTATE SPECIFIC ANTIGEN 04/05/2009  . UPPER RESPIRATORY INFECTION (URI) 08/24/2008  . LIPOMA 07/18/2007  . Dyslipidemia 07/18/2007  . DEPRESSION 07/18/2007   Past Medical History:  Diagnosis Date  . Depression   . Hyperlipidemia   . Hypertension   . Rectal bleeding     Family History  Problem Relation Age of Onset  . Cancer Father 64       prost ca    Past Surgical History:  Procedure Laterality Date  . ANAL FISSURE REPAIR    . PROSTATE BIOPSY     Social History   Occupational History  . Not on file  Tobacco Use  . Smoking status: Never Smoker  . Smokeless tobacco: Never Used  Substance and Sexual Activity  . Alcohol use: No  . Drug use: No  . Sexual activity: Yes

## 2018-11-13 ENCOUNTER — Encounter: Payer: Self-pay | Admitting: Emergency Medicine

## 2018-12-02 ENCOUNTER — Ambulatory Visit (INDEPENDENT_AMBULATORY_CARE_PROVIDER_SITE_OTHER): Payer: BLUE CROSS/BLUE SHIELD | Admitting: Psychiatry

## 2018-12-02 ENCOUNTER — Encounter: Payer: Self-pay | Admitting: Psychiatry

## 2018-12-02 VITALS — BP 127/76 | HR 64

## 2018-12-02 DIAGNOSIS — F3342 Major depressive disorder, recurrent, in full remission: Secondary | ICD-10-CM | POA: Diagnosis not present

## 2018-12-02 MED ORDER — SERTRALINE HCL 100 MG PO TABS: 150.0000 mg | ORAL_TABLET | Freq: Every day | ORAL | 2 refills | Status: DC

## 2018-12-02 NOTE — Progress Notes (Signed)
Kenneth Orozco [id number] 03/08/1961 58 y.o.  Subjective:   Patient ID:  Kenneth Orozco is a 58 y.o. (DOB [birth year]) male.  Chief Complaint:  Chief Complaint  Patient presents with  . Follow-up    h/o Depression    HPI Sem Mccaughey presents to the office today for follow-up of depression and anxiety. "I think I am ok." Mood has been stable. Denies anxiety. He reports that he needs to sleep more and tends to stay up later than he thinks he should. He reports that bright light makes him sleepy. Estimates sleeping about 6.5 hours of sleep a night. He reports that he would like to try to sleep at least 7 hours a night. Notices he feels more rested if he goes to bed later and sleeps later, regardless of sleep amount. He reports energy and motivation are "normal. " He reports that his appetite is good. Reports gaining some weight which he attributes to eating at night. He reports adequate concentration. Denies SI.   Family is doing ok. Work has been going ok.    Review of Systems:  Review of Systems  Musculoskeletal: Negative for gait problem.       Reports occasional muscle strain after playing soccer.  Reports occasional "tickling" in his wrists with driving and denies this happening any other time. Reports it is immediately relieved when he switches driving hand.   Neurological: Negative for tremors.  Psychiatric/Behavioral:       Please refer to HPI    Medications: I have reviewed the patient's current medications.  Current Outpatient Medications  Medication Sig Dispense Refill  . Cholecalciferol (VITAMIN D3) 2000 UNITS capsule Take 1 capsule (2,000 Units total) by mouth daily. 100 capsule 3  . sertraline (ZOLOFT) 100 MG tablet Take 1.5 tablets (150 mg total) by mouth daily. 135 tablet 2  . simvastatin (ZOCOR) 40 MG tablet Take 1 tablet (40 mg total) by mouth at bedtime. 90 tablet 3  . simvastatin (ZOCOR) 40 MG tablet TAKE 1 TABLET BY MOUTH EVERYDAY AT BEDTIME** NEED OFFICE VISIT 30  tablet 11  . methylPREDNISolone (MEDROL DOSEPAK) 4 MG TBPK tablet Use as directed (Patient not taking: Reported on 12/02/2018) 21 tablet 0  . traMADol (ULTRAM) 50 MG tablet Take 1 tablet (50 mg total) by mouth every 8 (eight) hours as needed. (Patient not taking: Reported on 12/02/2018) 15 tablet 0   No current facility-administered medications for this visit.     Medication Side Effects: None  Allergies: No Known Allergies  Past Medical History:  Diagnosis Date  . Depression   . Hyperlipidemia   . Hypertension   . Rectal bleeding     Family History  Problem Relation Age of Onset  . Cancer Father 82       prost ca  . Depression Father     Social History   Socioeconomic History  . Marital status: Married    Spouse name: Not on file  . Number of children: Not on file  . Years of education: Not on file  . Highest education level: Not on file  Occupational History  . Not on file  Social Needs  . Financial resource strain: Not on file  . Food insecurity:    Worry: Not on file    Inability: Not on file  . Transportation needs:    Medical: Not on file    Non-medical: Not on file  Tobacco Use  . Smoking status: Never Smoker  . Smokeless tobacco: Never Used  Substance and  Sexual Activity  . Alcohol use: No  . Drug use: No  . Sexual activity: Yes  Lifestyle  . Physical activity:    Days per week: Not on file    Minutes per session: Not on file  . Stress: Not on file  Relationships  . Social connections:    Talks on phone: Not on file    Gets together: Not on file    Attends religious service: Not on file    Active member of club or organization: Not on file    Attends meetings of clubs or organizations: Not on file    Relationship status: Not on file  . Intimate partner violence:    Fear of current or ex partner: Not on file    Emotionally abused: Not on file    Physically abused: Not on file    Forced sexual activity: Not on file  Other Topics Concern  . Not  on file  Social History Narrative  . Not on file    Past Medical History, Surgical history, Social history, and Family history were reviewed and updated as appropriate.   Please see review of systems for further details on the patient's review from today.   Objective:   Physical Exam:  BP 127/76   Pulse 64   Physical Exam Constitutional:      General: He is not in acute distress.    Appearance: He is well-developed.  Musculoskeletal:        General: No deformity.  Neurological:     Mental Status: He is alert and oriented to person, place, and time.     Coordination: Coordination normal.  Psychiatric:        Mood and Affect: Mood is not anxious or depressed. Affect is not labile, blunt, angry or inappropriate.        Speech: Speech normal.        Behavior: Behavior normal.        Thought Content: Thought content normal. Thought content does not include homicidal or suicidal ideation. Thought content does not include homicidal or suicidal plan.        Judgment: Judgment normal.     Comments: Insight intact. No auditory or visual hallucinations. No delusions.      Lab Review:     Component Value Date/Time   NA 139 01/06/2018 0900   K 4.3 01/06/2018 0900   CL 102 01/06/2018 0900   CO2 30 01/06/2018 0900   GLUCOSE 107 (H) 01/06/2018 0900   BUN 19 01/06/2018 0900   CREATININE 1.08 01/06/2018 0900   CALCIUM 9.3 01/06/2018 0900   PROT 7.2 01/06/2018 0900   ALBUMIN 4.4 01/06/2018 0900   AST 21 01/06/2018 0900   ALT 21 01/06/2018 0900   ALKPHOS 62 01/06/2018 0900   BILITOT 0.6 01/06/2018 0900   GFRNONAA 71.85 08/25/2010 0748   GFRAA 93 08/04/2008 1635       Component Value Date/Time   WBC 6.8 01/06/2018 0900   RBC 5.26 01/06/2018 0900   HGB 15.6 01/06/2018 0900   HCT 46.1 01/06/2018 0900   PLT 234.0 01/06/2018 0900   MCV 87.8 01/06/2018 0900   MCHC 33.9 01/06/2018 0900   RDW 13.3 01/06/2018 0900   LYMPHSABS 1.5 01/06/2018 0900   MONOABS 0.6 01/06/2018 0900    EOSABS 0.2 01/06/2018 0900   BASOSABS 0.0 01/06/2018 0900    No results found for: POCLITH, LITHIUM   No results found for: PHENYTOIN, PHENOBARB, VALPROATE, CBMZ   .res Assessment: Plan:  Continue Sertraline 150 mg po qd since mood remains stable.  Recurrent major depressive disorder, in full remission ([place]) - Plan: sertraline (ZOLOFT) 100 MG tablet  Please see After Visit Summary for patient specific instructions.  Future Appointments  Date Time Provider [place]  06/02/2019  4:30 PM Thayer Headings, PMHNP [facility] None    No orders of the defined types were placed in this encounter.     -------------------------------

## 2018-12-09 DIAGNOSIS — L723 Sebaceous cyst: Secondary | ICD-10-CM | POA: Diagnosis not present

## 2018-12-09 DIAGNOSIS — B078 Other viral warts: Secondary | ICD-10-CM | POA: Diagnosis not present

## 2018-12-09 DIAGNOSIS — D225 Melanocytic nevi of trunk: Secondary | ICD-10-CM | POA: Diagnosis not present

## 2018-12-09 DIAGNOSIS — L821 Other seborrheic keratosis: Secondary | ICD-10-CM | POA: Diagnosis not present

## 2019-01-01 ENCOUNTER — Other Ambulatory Visit: Payer: Self-pay | Admitting: Internal Medicine

## 2019-02-18 ENCOUNTER — Ambulatory Visit: Payer: BLUE CROSS/BLUE SHIELD | Admitting: Internal Medicine

## 2019-03-25 ENCOUNTER — Ambulatory Visit: Payer: BLUE CROSS/BLUE SHIELD | Admitting: Internal Medicine

## 2019-04-01 ENCOUNTER — Other Ambulatory Visit: Payer: Self-pay | Admitting: Internal Medicine

## 2019-05-26 ENCOUNTER — Ambulatory Visit: Payer: BLUE CROSS/BLUE SHIELD | Admitting: Internal Medicine

## 2019-06-01 DIAGNOSIS — D176 Benign lipomatous neoplasm of spermatic cord: Secondary | ICD-10-CM | POA: Diagnosis not present

## 2019-06-01 DIAGNOSIS — R972 Elevated prostate specific antigen [PSA]: Secondary | ICD-10-CM | POA: Diagnosis not present

## 2019-06-02 ENCOUNTER — Encounter: Payer: Self-pay | Admitting: Psychiatry

## 2019-06-02 ENCOUNTER — Ambulatory Visit: Payer: BLUE CROSS/BLUE SHIELD | Admitting: Psychiatry

## 2019-06-02 ENCOUNTER — Ambulatory Visit: Payer: BC Managed Care – PPO | Admitting: Psychiatry

## 2019-06-02 ENCOUNTER — Other Ambulatory Visit: Payer: Self-pay

## 2019-06-02 DIAGNOSIS — F3342 Major depressive disorder, recurrent, in full remission: Secondary | ICD-10-CM | POA: Diagnosis not present

## 2019-06-02 MED ORDER — SERTRALINE HCL 100 MG PO TABS: 150.0000 mg | ORAL_TABLET | Freq: Every day | ORAL | 2 refills | Status: DC

## 2019-06-02 NOTE — Progress Notes (Signed)
Kenneth Orozco [id number] [birth year] 58 y.o.  Virtual Visit via Telephone Note  I connected with pt on 06/02/19 at  3:00 PM EDT by telephone and verified that I am speaking with the correct person using two identifiers.   I discussed the limitations, risks, security and privacy concerns of performing an evaluation and management service by telephone and the availability of in person appointments. I also discussed with the patient that there may be a patient responsible charge related to this service. The patient expressed understanding and agreed to proceed.   I discussed the assessment and treatment plan with the patient. The patient was provided an opportunity to ask questions and all were answered. The patient agreed with the plan and demonstrated an understanding of the instructions.   The patient was advised to call back or seek an in-person evaluation if the symptoms worsen or if the condition fails to improve as anticipated.  I provided 30 minutes of non-face-to-face time during this encounter.  The patient was located at home.  The provider was located at [place].   Thayer Headings, PMHNP   Subjective:   Patient ID:  Kenneth Orozco is a 58 y.o. (DOB 06-12-1961) male.  Chief Complaint:  Chief Complaint  Patient presents with  . Follow-up    h/o Depression    HPI Kenneth Orozco presents for follow-up of depression. He denies complaints. He reports that he has been working from home and staying at home for the majority of the time. He reports that he and his wife have been taking walks and getting out to exercise and otherwise have been staying home. He denies any anxiety. Denies depressed mood. He reports that he would likely benefit from going to bed earlier. He reports that his appetite has been good. Reports that energy and motivation have been good. He reports adequate concentration. Denies SI.   Has family in [place]. Wife continues to work in her office.    Review of  Systems:  Review of Systems  Musculoskeletal: Negative for gait problem.  Neurological: Negative for tremors.  Psychiatric/Behavioral:       Please refer to HPI    Medications: I have reviewed the patient's current medications.  Current Outpatient Medications  Medication Sig Dispense Refill  . Cholecalciferol (VITAMIN D3) 2000 UNITS capsule Take 1 capsule (2,000 Units total) by mouth daily. 100 capsule 3  . simvastatin (ZOCOR) 40 MG tablet Take 1 tablet (40 mg total) by mouth at bedtime. 90 tablet 0  . methylPREDNISolone (MEDROL DOSEPAK) 4 MG TBPK tablet Use as directed (Patient not taking: Reported on 12/02/2018) 21 tablet 0  . sertraline (ZOLOFT) 100 MG tablet Take 1.5 tablets (150 mg total) by mouth daily. 135 tablet 2  . traMADol (ULTRAM) 50 MG tablet Take 1 tablet (50 mg total) by mouth every 8 (eight) hours as needed. (Patient not taking: Reported on 12/02/2018) 15 tablet 0   No current facility-administered medications for this visit.     Medication Side Effects: None  Allergies: No Known Allergies  Past Medical History:  Diagnosis Date  . Depression   . Hyperlipidemia   . Hypertension   . Rectal bleeding     Family History  Problem Relation Age of Onset  . Cancer Father 79       prost ca  . Depression Father     Social History   Socioeconomic History  . Marital status: Married    Spouse name: Not on file  . Number of children: Not on  file  . Years of education: Not on file  . Highest education level: Not on file  Occupational History  . Not on file  Social Needs  . Financial resource strain: Not on file  . Food insecurity    Worry: Not on file    Inability: Not on file  . Transportation needs    Medical: Not on file    Non-medical: Not on file  Tobacco Use  . Smoking status: Never Smoker  . Smokeless tobacco: Never Used  Substance and Sexual Activity  . Alcohol use: No  . Drug use: No  . Sexual activity: Yes  Lifestyle  . Physical activity     Days per week: Not on file    Minutes per session: Not on file  . Stress: Not on file  Relationships  . Social Herbalist on phone: Not on file    Gets together: Not on file    Attends religious service: Not on file    Active member of club or organization: Not on file    Attends meetings of clubs or organizations: Not on file    Relationship status: Not on file  . Intimate partner violence    Fear of current or ex partner: Not on file    Emotionally abused: Not on file    Physically abused: Not on file    Forced sexual activity: Not on file  Other Topics Concern  . Not on file  Social History Narrative  . Not on file    Past Medical History, Surgical history, Social history, and Family history were reviewed and updated as appropriate.   Please see review of systems for further details on the patient's review from today.   Objective:   Physical Exam:  There were no vitals taken for this visit.  Physical Exam Neurological:     Mental Status: He is alert and oriented to person, place, and time.     Cranial Nerves: No dysarthria.  Psychiatric:        Attention and Perception: Attention normal.        Mood and Affect: Mood normal.        Speech: Speech normal.        Behavior: Behavior is cooperative.        Thought Content: Thought content normal. Thought content is not paranoid or delusional. Thought content does not include homicidal or suicidal ideation. Thought content does not include homicidal or suicidal plan.        Cognition and Memory: Cognition and memory normal.        Judgment: Judgment normal.     Lab Review:     Component Value Date/Time   NA 139 01/06/2018 0900   K 4.3 01/06/2018 0900   CL 102 01/06/2018 0900   CO2 30 01/06/2018 0900   GLUCOSE 107 (H) 01/06/2018 0900   BUN 19 01/06/2018 0900   CREATININE 1.08 01/06/2018 0900   CALCIUM 9.3 01/06/2018 0900   PROT 7.2 01/06/2018 0900   ALBUMIN 4.4 01/06/2018 0900   AST 21 01/06/2018 0900    ALT 21 01/06/2018 0900   ALKPHOS 62 01/06/2018 0900   BILITOT 0.6 01/06/2018 0900   GFRNONAA 71.85 08/25/2010 0748   GFRAA 93 08/04/2008 1635       Component Value Date/Time   WBC 6.8 01/06/2018 0900   RBC 5.26 01/06/2018 0900   HGB 15.6 01/06/2018 0900   HCT 46.1 01/06/2018 0900   PLT 234.0 01/06/2018 0900  MCV 87.8 01/06/2018 0900   MCHC 33.9 01/06/2018 0900   RDW 13.3 01/06/2018 0900   LYMPHSABS 1.5 01/06/2018 0900   MONOABS 0.6 01/06/2018 0900   EOSABS 0.2 01/06/2018 0900   BASOSABS 0.0 01/06/2018 0900    No results found for: POCLITH, LITHIUM   No results found for: PHENYTOIN, PHENOBARB, VALPROATE, CBMZ   .res Assessment: Plan:   Will continue sertraline 150 mg daily since depressive signs and symptoms are fully controlled at this time and patient is tolerating sertraline without difficulty. Patient to follow-up in 6 months or sooner if clinically indicated. Patient advised to contact office with any questions, adverse effects, or acute worsening in signs and symptoms.  Zahir was seen today for follow-up.  Diagnoses and all orders for this visit:  Recurrent major depressive disorder, in full remission ([place]) -     sertraline (ZOLOFT) 100 MG tablet; Take 1.5 tablets (150 mg total) by mouth daily.    Please see After Visit Summary for patient specific instructions.  Future Appointments  Date Time Provider [place]  08/04/2019  7:50 AM Plotnikov, Evie Lacks, MD [id number]  12/04/2019  4:00 PM Thayer Headings, PMHNP [facility] None    No orders of the defined types were placed in this encounter.     -------------------------------

## 2019-06-30 ENCOUNTER — Other Ambulatory Visit: Payer: Self-pay | Admitting: Internal Medicine

## 2019-07-02 MED ORDER — SIMVASTATIN 40 MG PO TABS: 40.0000 mg | ORAL_TABLET | Freq: Every day | ORAL | 0 refills | Status: DC

## 2019-07-02 NOTE — Telephone Encounter (Signed)
Ok, simvastatin refill sent. See meds.

## 2019-07-02 NOTE — Addendum Note (Signed)
Addended by: Cresenciano Lick on: 07/02/2019 02:30 PM   Modules accepted: Orders

## 2019-07-02 NOTE — Telephone Encounter (Signed)
Appt scheduled, please advise on refill

## 2019-07-21 DIAGNOSIS — R972 Elevated prostate specific antigen [PSA]: Secondary | ICD-10-CM | POA: Diagnosis not present

## 2019-08-04 ENCOUNTER — Other Ambulatory Visit (INDEPENDENT_AMBULATORY_CARE_PROVIDER_SITE_OTHER): Payer: BC Managed Care – PPO

## 2019-08-04 ENCOUNTER — Other Ambulatory Visit: Payer: Self-pay

## 2019-08-04 ENCOUNTER — Ambulatory Visit (INDEPENDENT_AMBULATORY_CARE_PROVIDER_SITE_OTHER): Payer: BC Managed Care – PPO | Admitting: Internal Medicine

## 2019-08-04 ENCOUNTER — Encounter: Payer: Self-pay | Admitting: Internal Medicine

## 2019-08-04 VITALS — BP 126/78 | HR 62 | Temp 98.0°F | Ht 64.0 in | Wt 187.0 lb

## 2019-08-04 DIAGNOSIS — Z0000 Encounter for general adult medical examination without abnormal findings: Secondary | ICD-10-CM

## 2019-08-04 DIAGNOSIS — Z23 Encounter for immunization: Secondary | ICD-10-CM

## 2019-08-04 DIAGNOSIS — E785 Hyperlipidemia, unspecified: Secondary | ICD-10-CM | POA: Diagnosis not present

## 2019-08-04 DIAGNOSIS — R972 Elevated prostate specific antigen [PSA]: Secondary | ICD-10-CM | POA: Diagnosis not present

## 2019-08-04 LAB — CBC WITH DIFFERENTIAL/PLATELET
Basophils Absolute: 0.1 10*3/uL (ref 0.0–0.1)
Basophils Relative: 0.8 % (ref 0.0–3.0)
Eosinophils Absolute: 0.3 10*3/uL (ref 0.0–0.7)
Eosinophils Relative: 4.6 % (ref 0.0–5.0)
HCT: 45.1 % (ref 39.0–52.0)
Hemoglobin: 15.5 g/dL (ref 13.0–17.0)
Lymphocytes Relative: 27.4 % (ref 12.0–46.0)
Lymphs Abs: 1.8 10*3/uL (ref 0.7–4.0)
MCHC: 34.4 g/dL (ref 30.0–36.0)
MCV: 86.7 fl (ref 78.0–100.0)
Monocytes Absolute: 0.7 10*3/uL (ref 0.1–1.0)
Monocytes Relative: 9.8 % (ref 3.0–12.0)
Neutro Abs: 3.8 10*3/uL (ref 1.4–7.7)
Neutrophils Relative %: 57.4 % (ref 43.0–77.0)
Platelets: 209 10*3/uL (ref 150.0–400.0)
RBC: 5.21 Mil/uL (ref 4.22–5.81)
RDW: 13.4 % (ref 11.5–15.5)
WBC: 6.6 10*3/uL (ref 4.0–10.5)

## 2019-08-04 LAB — HEPATIC FUNCTION PANEL
ALT: 17 U/L (ref 0–53)
AST: 21 U/L (ref 0–37)
Albumin: 4.4 g/dL (ref 3.5–5.2)
Alkaline Phosphatase: 67 U/L (ref 39–117)
Bilirubin, Direct: 0.1 mg/dL (ref 0.0–0.3)
Total Bilirubin: 0.6 mg/dL (ref 0.2–1.2)
Total Protein: 7.2 g/dL (ref 6.0–8.3)

## 2019-08-04 LAB — URINALYSIS
Bilirubin Urine: NEGATIVE
Ketones, ur: NEGATIVE
Leukocytes,Ua: NEGATIVE
Nitrite: NEGATIVE
Specific Gravity, Urine: 1.025 (ref 1.000–1.030)
Total Protein, Urine: NEGATIVE
Urine Glucose: NEGATIVE
Urobilinogen, UA: 0.2 (ref 0.0–1.0)
pH: 6 (ref 5.0–8.0)

## 2019-08-04 LAB — BASIC METABOLIC PANEL
BUN: 16 mg/dL (ref 6–23)
CO2: 29 mEq/L (ref 19–32)
Calcium: 9.6 mg/dL (ref 8.4–10.5)
Chloride: 102 mEq/L (ref 96–112)
Creatinine, Ser: 1.03 mg/dL (ref 0.40–1.50)
GFR: 74.2 mL/min (ref >60.00)
Glucose, Bld: 89 mg/dL (ref 70–99)
Potassium: 3.7 mEq/L (ref 3.5–5.1)
Sodium: 139 mEq/L (ref 135–145)

## 2019-08-04 LAB — TSH: TSH: 1.54 u[IU]/mL (ref 0.35–4.50)

## 2019-08-04 LAB — LIPID PANEL
Cholesterol: 209 mg/dL — ABNORMAL HIGH (ref 0–200)
HDL: 63.9 mg/dL (ref >39.00)
LDL Cholesterol: 126 mg/dL — ABNORMAL HIGH (ref 0–99)
NonHDL: 144.82
Total CHOL/HDL Ratio: 3
Triglycerides: 96 mg/dL (ref 0.0–149.0)
VLDL: 19.2 mg/dL (ref 0.0–40.0)

## 2019-08-04 MED ORDER — SIMVASTATIN 40 MG PO TABS: 40.0000 mg | ORAL_TABLET | Freq: Every day | ORAL | 3 refills | Status: DC

## 2019-08-04 NOTE — Patient Instructions (Signed)
Cardiac CT calcium scoring test $150   Computed tomography, more commonly known as a CT or CAT scan, is a diagnostic medical imaging test. Like traditional x-rays, it produces multiple images or pictures of the inside of the body. The cross-sectional images generated during a CT scan can be reformatted in multiple planes. They can even generate three-dimensional images. These images can be viewed on a computer monitor, printed on film or by a 3D printer, or transferred to a CD or DVD. CT images of internal organs, bones, soft tissue and blood vessels provide greater detail than traditional x-rays, particularly of soft tissues and blood vessels. A cardiac CT scan for coronary calcium is a non-invasive way of obtaining information about the presence, location and extent of calcified plaque in the coronary arteries-the vessels that supply oxygen-containing blood to the heart muscle. Calcified plaque results when there is a build-up of fat and other substances under the inner layer of the artery. This material can calcify which signals the presence of atherosclerosis, a disease of the vessel wall, also called coronary artery disease (CAD). People with this disease have an increased risk for heart attacks. In addition, over time, progression of plaque build up (CAD) can narrow the arteries or even close off blood flow to the heart. The result may be chest pain, sometimes called "angina," or a heart attack. Because calcium is a marker of CAD, the amount of calcium detected on a cardiac CT scan is a helpful prognostic tool. The findings on cardiac CT are expressed as a calcium score. Another name for this test is coronary artery calcium scoring.  What are some common uses of the procedure? The goal of cardiac CT scan for calcium scoring is to determine if CAD is present and to what extent, even if there are no symptoms. It is a screening study that may be recommended by a physician for patients with risk factors  for CAD but no clinical symptoms. The major risk factors for CAD are: . high blood cholesterol levels  . family history of heart attacks  . diabetes  . high blood pressure  . cigarette smoking  . overweight or obese  . physical inactivity   A negative cardiac CT scan for calcium scoring shows no calcification within the coronary arteries. This suggests that CAD is absent or so minimal it cannot be seen by this technique. The chance of having a heart attack over the next two to five years is very low under these circumstances. A positive test means that CAD is present, regardless of whether or not the patient is experiencing any symptoms. The amount of calcification-expressed as the calcium score-may help to predict the likelihood of a myocardial infarction (heart attack) in the coming years and helps your medical doctor or cardiologist decide whether the patient may need to take preventive medicine or undertake other measures such as diet and exercise to lower the risk for heart attack. The extent of CAD is graded according to your calcium score:  Calcium Score  Presence of CAD (coronary artery disease)  0 No evidence of CAD   1-10 Minimal evidence of CAD  11-100 Mild evidence of CAD  101-400 Moderate evidence of CAD  Over 400 Extensive evidence of CAD     These suggestions will probably help you to improve your metabolism if you are not overweight and to lose weight if you are overweight: 1.  Reduce your consumption of sugars and starches.  Eliminate high fructose corn syrup from your   diet.  Reduce your consumption of processed foods.  For desserts try to have seasonal fruits, berries, nuts, cheeses or dark chocolate with more than 70% cacao. 2.  Do not snack 3.  You do not have to eat breakfast.  If you choose to have breakfast-eat plain greek yogurt, eggs, oatmeal (without sugar) 4.  Drink water, freshly brewed unsweetened tea (green, black or herbal) or coffee.  Do not drink sodas  including diet sodas , juices, beverages sweetened with artificial sweeteners. 5.  Reduce your consumption of refined grains. 6.  Avoid protein drinks such as Optifast, Slim fast etc. Eat chicken, fish, meat, dairy and beans for your sources of protein 7.  Natural unprocessed fats like cold pressed virgin olive oil, butter, coconut oil are good for you.  Eat avocados 8.  Increase your consumption of fiber.  Fruits, berries, vegetables, whole grains, flaxseeds, Chia seeds, beans, popcorn, nuts, oatmeal are good sources of fiber 9.  Use vinegar in your diet, i.e. apple cider vinegar, red wine or balsamic vinegar 10.  You can try fasting.  For example you can skip breakfast and lunch every other day (24-hour fast) 11.  Stress reduction, good night sleep, relaxation, meditation, yoga and other physical activity is likely to help you to maintain low weight too. 12.  If you drink alcohol, limit your alcohol intake to no more than 2 drinks a day.   Mediterranean diet is good for you. ([employer] has a typical Mediterranean cuisine menu) The Mediterranean diet is a way of eating based on the traditional cuisine of countries bordering the [place] Sea. While there is no single definition of the Mediterranean diet, it is typically high in vegetables, fruits, whole grains, beans, nut and seeds, and olive oil. The main components of Mediterranean diet include: . Daily consumption of vegetables, fruits, whole grains and healthy fats  . Weekly intake of fish, poultry, beans and eggs  . Moderate portions of dairy products  . Limited intake of red meat Other important elements of the Mediterranean diet are sharing meals with family and friends, enjoying a glass of red wine and being physically active. Health benefits of a Mediterranean diet: A traditional Mediterranean diet consisting of large quantities of fresh fruits and vegetables, nuts, fish and olive oil-coupled with physical activity-can reduce  your risk of serious mental and physical health problems by: Preventing heart disease and strokes. Following a Mediterranean diet limits your intake of refined breads, processed foods, and red meat, and encourages drinking red wine instead of hard liquor-all factors that can help prevent heart disease and stroke. Keeping you agile. If you're an older adult, the nutrients gained with a Mediterranean diet may reduce your risk of developing muscle weakness and other signs of frailty by about 70 percent. Reducing the risk of Alzheimer's. Research suggests that the Mediterranean diet may improve cholesterol, blood sugar levels, and overall blood vessel health, which in turn may reduce your risk of Alzheimer's disease or dementia. Halving the risk of Parkinson's disease. The high levels of antioxidants in the Mediterranean diet can prevent cells from undergoing a damaging process called oxidative stress, thereby cutting the risk of Parkinson's disease in half. Increasing longevity. By reducing your risk of developing heart disease or cancer with the Mediterranean diet, you're reducing your risk of death at any age by 20%. Protecting against type 2 diabetes. A Mediterranean diet is rich in fiber which digests slowly, prevents huge swings in blood sugar, and can help you maintain a healthy   weight.    Cabbage soup recipe that will not make you gain weight: Take 1 small head of cabbage, 1 average pack of celery, 4 green peppers, 4 onions, 2 cans diced tomatoes (they are not available without salt), salt and spices to taste.  Chop cabbage, celery, peppers and onions.  And tomatoes and 2-2.5 liters (2.5 quarts) of water so that it would just cover the vegetables.  Bring to boil.  Add spices and salt.  Turn heat to low/medium and simmer for 20-25 minutes.  Naturally, you can make a smaller batch and change some of the ingredients.  

## 2019-08-04 NOTE — Assessment & Plan Note (Signed)
PSA was 5.7 four mo ago; 4.6 one mo ago

## 2019-08-04 NOTE — Assessment & Plan Note (Addendum)
We discussed age appropriate health related issues, including available/recomended screening tests and vaccinations. We discussed a need for adhering to healthy diet and exercise. Labs were reviewed/ordered. All questions were answered.  A cardiac CT scan for coronary calcium offered  Dr Collene Mares said colon is due in 2022. PSA per Dr Diona Fanti

## 2019-08-04 NOTE — Progress Notes (Signed)
Subjective:  [name], male    DOB: Mar 08, 1961  Age: 58 y.o. MRN: [id number]  CC: No chief complaint on file.   HPI Erika Wooster presents for well exam PSA was 5.7 four mo ago; 4.6 one mo ago  Outpatient Medications Prior to Visit  Medication Sig Dispense Refill  . Cholecalciferol (VITAMIN D3) 2000 UNITS capsule Take 1 capsule (2,000 Units total) by mouth daily. 100 capsule 3  . sertraline (ZOLOFT) 100 MG tablet Take 1.5 tablets (150 mg total) by mouth daily. 135 tablet 2  . simvastatin (ZOCOR) 40 MG tablet Take 1 tablet (40 mg total) by mouth at bedtime. 90 tablet 0  . traMADol (ULTRAM) 50 MG tablet Take 1 tablet (50 mg total) by mouth every 8 (eight) hours as needed. (Patient not taking: Reported on 08/04/2019) 15 tablet 0  . methylPREDNISolone (MEDROL DOSEPAK) 4 MG TBPK tablet Use as directed (Patient not taking: Reported on 12/02/2018) 21 tablet 0   No facility-administered medications prior to visit.     ROS: Review of Systems  Constitutional: Negative for appetite change, fatigue and unexpected weight change.  HENT: Negative for congestion, nosebleeds, sneezing, sore throat and trouble swallowing.   Eyes: Negative for itching and visual disturbance.  Respiratory: Negative for cough.   Cardiovascular: Negative for chest pain, palpitations and leg swelling.  Gastrointestinal: Negative for abdominal distention, blood in stool, diarrhea and nausea.  Genitourinary: Negative for frequency and hematuria.  Musculoskeletal: Negative for back pain, gait problem, joint swelling and neck pain.  Skin: Negative for rash.  Neurological: Negative for dizziness, tremors, speech difficulty and weakness.  Psychiatric/Behavioral: Negative for agitation, dysphoric mood and sleep disturbance. The patient is not nervous/anxious.     Objective:  BP 126/78 (BP Location: Left Arm, Patient Position: Sitting, Cuff Size: Large)   Pulse 62   Temp 98 F (36.7 C) (Oral)   Ht 5\' 4"  (1.626  m)   Wt 187 lb (84.8 kg)   SpO2 98%   BMI 32.10 kg/m   BP Readings from Last 3 Encounters:  08/04/19 126/78  01/06/18 120/76  11/08/17 136/78    Wt Readings from Last 3 Encounters:  08/04/19 187 lb (84.8 kg)  06/05/18 177 lb (80.3 kg)  01/06/18 177 lb (80.3 kg)    Physical Exam Constitutional:      General: He is not in acute distress.    Appearance: He is well-developed.     Comments: NAD  Eyes:     Conjunctiva/sclera: Conjunctivae normal.     Pupils: Pupils are equal, round, and reactive to light.  Neck:     Musculoskeletal: Normal range of motion.     Thyroid: No thyromegaly.     Vascular: No JVD.  Cardiovascular:     Rate and Rhythm: Normal rate and regular rhythm.     Heart sounds: Normal heart sounds. No murmur. No friction rub. No gallop.   Pulmonary:     Effort: Pulmonary effort is normal. No respiratory distress.     Breath sounds: Normal breath sounds. No wheezing or rales.  Chest:     Chest wall: No tenderness.  Abdominal:     General: Bowel sounds are normal. There is no distension.     Palpations: Abdomen is soft. There is no mass.     Tenderness: There is no abdominal tenderness. There is no guarding or rebound.  Musculoskeletal: Normal range of motion.        General: No tenderness.  Lymphadenopathy:  Cervical: No cervical adenopathy.  Skin:    General: Skin is warm and dry.     Findings: No rash.  Neurological:     Mental Status: He is alert and oriented to person, place, and time.     Cranial Nerves: No cranial nerve deficit.     Motor: No abnormal muscle tone.     Coordination: Coordination normal.     Gait: Gait normal.     Deep Tendon Reflexes: Reflexes are normal and symmetric.  Psychiatric:        Behavior: Behavior normal.        Thought Content: Thought content normal.        Judgment: Judgment normal.   rectal - per Urology  Lab Results  Component Value Date   WBC 6.8 01/06/2018   HGB 15.6 01/06/2018   HCT 46.1 01/06/2018    PLT 234.0 01/06/2018   GLUCOSE 107 (H) 01/06/2018   CHOL 188 01/06/2018   TRIG 65.0 01/06/2018   HDL 66.10 01/06/2018   LDLDIRECT 145.6 08/01/2012   LDLCALC 109 (H) 01/06/2018   ALT 21 01/06/2018   AST 21 01/06/2018   NA 139 01/06/2018   K 4.3 01/06/2018   CL 102 01/06/2018   CREATININE 1.08 01/06/2018   BUN 19 01/06/2018   CO2 30 01/06/2018   TSH 1.13 01/06/2018   PSA 2.61 08/25/2010    Dg Lumbar Spine 2-3 Views  Result Date: 10/15/2011 *RADIOLOGY REPORT* Clinical Data: Low back pain, recently lifted heavy couch LUMBAR SPINE - 2-3 VIEW Comparison: None. Findings: The lumbar vertebrae are straightened in alignment. Intervertebral disc spaces appear normal.  No compression deformity is seen.  The SI joints appear normal. IMPRESSION: Straightened alignment.  Normal intervertebral disc spaces. Original Report Authenticated By: Joretta Bachelor, M.D.   Assessment & Plan:   There are no diagnoses linked to this encounter.   No orders of the defined types were placed in this encounter.    Follow-up: No follow-ups on file.  Walker Kehr, MD

## 2019-09-18 ENCOUNTER — Encounter: Payer: Self-pay | Admitting: Psychiatry

## 2019-09-18 ENCOUNTER — Other Ambulatory Visit: Payer: Self-pay

## 2019-09-18 ENCOUNTER — Ambulatory Visit (INDEPENDENT_AMBULATORY_CARE_PROVIDER_SITE_OTHER): Payer: BC Managed Care – PPO | Admitting: Psychiatry

## 2019-09-18 DIAGNOSIS — F330 Major depressive disorder, recurrent, mild: Secondary | ICD-10-CM

## 2019-09-18 MED ORDER — SERTRALINE HCL 100 MG PO TABS: 200.0000 mg | ORAL_TABLET | Freq: Every day | ORAL | 0 refills | Status: DC

## 2019-09-18 NOTE — Progress Notes (Signed)
[name] [id number] [birth year] 58 y.o.  Subjective:   Patient ID:  Kenneth Orozco is a 58 y.o. (DOB Nov 15, 1961) male.  Chief Complaint:  Chief Complaint  Patient presents with  . Depression    HPI Kenneth Orozco presents to the office today for recent worsening in mood and anxiety which he reports is in response to significant work stressor 4 weeks ago. Reports that his depression is now 5-6 out of 10, with 10= most depressed, and typically mood is 1-2. He reports that he has also had increased difficulty with concentration. Some anxiety and worry related to work situation. He reports that his mood is typically worse in the morning and he has improved mood, energy, and motivation after 9-10 pm and continues until 1-2 am. He reports that he has to consciously go to bed around 11 pm. He reports energy and motivation are slightly lower, however he is still able to complete responsibilities. He reports that he usually enjoys eating 3 meals and that he has recently been eating only 2 meals. He reports that concentration has been more challenging and has been trying to do tasks that require increased focus in the morning. Reports enjoying seeing his adult children recently. He has been more withdrawn and has been talking less with his wife. Denies SI.   He has been running a couple of times a week and was previously running daily. Has not been playing league soccer to minimize risk of COVID.   He and his wife recently celebrated their 77th anniversary.  Past Psychiatric Medication Trials: Sertraline  Review of Systems:  Review of Systems  Constitutional: Positive for fatigue.  Gastrointestinal: Positive for nausea.       Some increase in heartburn and nausea  Musculoskeletal: Negative for gait problem.  Neurological: Negative for tremors.  Psychiatric/Behavioral:       Please refer to HPI    Medications: I have reviewed the patient's current medications.  Current Outpatient Medications   Medication Sig Dispense Refill  . Cholecalciferol (VITAMIN D3) 2000 UNITS capsule Take 1 capsule (2,000 Units total) by mouth daily. 100 capsule 3  . simvastatin (ZOCOR) 40 MG tablet Take 1 tablet (40 mg total) by mouth at bedtime. 90 tablet 3  . sertraline (ZOLOFT) 100 MG tablet Take 2 tablets (200 mg total) by mouth daily. 180 tablet 0  . traMADol (ULTRAM) 50 MG tablet Take 1 tablet (50 mg total) by mouth every 8 (eight) hours as needed. (Patient not taking: Reported on 08/04/2019) 15 tablet 0   No current facility-administered medications for this visit.     Medication Side Effects: None  Allergies: No Known Allergies  Past Medical History:  Diagnosis Date  . Depression   . Hyperlipidemia   . Hypertension   . Rectal bleeding     Family History  Problem Relation Age of Onset  . Cancer Father 44       prost ca  . Depression Father     Social History   Socioeconomic History  . Marital status: Married    Spouse name: Not on file  . Number of children: Not on file  . Years of education: Not on file  . Highest education level: Not on file  Occupational History  . Not on file  Social Needs  . Financial resource strain: Not on file  . Food insecurity    Worry: Not on file    Inability: Not on file  . Transportation needs    Medical: Not on file  Non-medical: Not on file  Tobacco Use  . Smoking status: Never Smoker  . Smokeless tobacco: Never Used  Substance and Sexual Activity  . Alcohol use: No  . Drug use: No  . Sexual activity: Yes  Lifestyle  . Physical activity    Days per week: Not on file    Minutes per session: Not on file  . Stress: Not on file  Relationships  . Social Herbalist on phone: Not on file    Gets together: Not on file    Attends religious service: Not on file    Active member of club or organization: Not on file    Attends meetings of clubs or organizations: Not on file    Relationship status: Not on file  . Intimate  partner violence    Fear of current or ex partner: Not on file    Emotionally abused: Not on file    Physically abused: Not on file    Forced sexual activity: Not on file  Other Topics Concern  . Not on file  Social History Narrative  . Not on file    Past Medical History, Surgical history, Social history, and Family history were reviewed and updated as appropriate.   Please see review of systems for further details on the patient's review from today.   Objective:   Physical Exam:  There were no vitals taken for this visit.  Physical Exam Constitutional:      General: He is not in acute distress.    Appearance: He is well-developed.  Musculoskeletal:        General: No deformity.  Neurological:     Mental Status: He is alert and oriented to person, place, and time.     Coordination: Coordination normal.  Psychiatric:        Attention and Perception: Attention and perception normal. He does not perceive auditory or visual hallucinations.        Mood and Affect: Mood is anxious and depressed. Affect is not labile, blunt, angry or inappropriate.        Speech: Speech normal.        Behavior: Behavior normal.        Thought Content: Thought content is not paranoid or delusional. Thought content does not include homicidal or suicidal ideation. Thought content does not include homicidal or suicidal plan.        Cognition and Memory: Cognition and memory normal.        Judgment: Judgment normal.     Comments: Insight intact. No delusions.  Some rumination re: work stressor     Lab Review:     Component Value Date/Time   NA 139 08/04/2019 0817   K 3.7 08/04/2019 0817   CL 102 08/04/2019 0817   CO2 29 08/04/2019 0817   GLUCOSE 89 08/04/2019 0817   BUN 16 08/04/2019 0817   CREATININE 1.03 08/04/2019 0817   CALCIUM 9.6 08/04/2019 0817   PROT 7.2 08/04/2019 0817   ALBUMIN 4.4 08/04/2019 0817   AST 21 08/04/2019 0817   ALT 17 08/04/2019 0817   ALKPHOS 67 08/04/2019 0817    BILITOT 0.6 08/04/2019 0817   GFRNONAA 71.85 08/25/2010 0748   GFRAA 93 08/04/2008 1635       Component Value Date/Time   WBC 6.6 08/04/2019 0817   RBC 5.21 08/04/2019 0817   HGB 15.5 08/04/2019 0817   HCT 45.1 08/04/2019 0817   PLT 209.0 08/04/2019 0817   MCV 86.7 08/04/2019 0817  MCHC 34.4 08/04/2019 0817   RDW 13.4 08/04/2019 0817   LYMPHSABS 1.8 08/04/2019 0817   MONOABS 0.7 08/04/2019 0817   EOSABS 0.3 08/04/2019 0817   BASOSABS 0.1 08/04/2019 0817    No results found for: POCLITH, LITHIUM   No results found for: PHENYTOIN, PHENOBARB, VALPROATE, CBMZ   .res Assessment: Plan:   Patient seen for 30 minutes and greater than 50% of visit spent counseling patient regarding recent stressors and identifying triggers to the situation.  Discussed recent effects on his mood, relationships, and overall function.  Discussed option of continuing current medications and closely monitoring for any worsening signs and symptoms or increasing sertraline to 200 mg daily to improve mood and anxiety signs and symptoms.  Discussed potential benefits, risks, and side effects of increasing sertraline.  Patient agrees to increase in sertraline. Patient to follow-up in 2 to 3 months or sooner if clinically indicated. Patient advised to contact office with any questions, adverse effects, or acute worsening in signs and symptoms.  Kenneth Orozco was seen today for depression.  Diagnoses and all orders for this visit:  Mild episode of recurrent major depressive disorder (HCC) -     sertraline (ZOLOFT) 100 MG tablet; Take 2 tablets (200 mg total) by mouth daily.     Please see After Visit Summary for patient specific instructions.  Future Appointments  Date Time Provider [place]  11/18/2019  8:00 AM Thayer Headings, PMHNP [facility] None  12/04/2019  4:00 PM Thayer Headings, PMHNP [facility] None    No orders of the defined types were placed in this encounter.   -------------------------------

## 2019-09-28 ENCOUNTER — Other Ambulatory Visit: Payer: Self-pay | Admitting: Internal Medicine

## 2019-11-18 ENCOUNTER — Ambulatory Visit (INDEPENDENT_AMBULATORY_CARE_PROVIDER_SITE_OTHER): Payer: BC Managed Care – PPO | Admitting: Psychiatry

## 2019-11-18 ENCOUNTER — Encounter: Payer: Self-pay | Admitting: Psychiatry

## 2019-11-18 DIAGNOSIS — F330 Major depressive disorder, recurrent, mild: Secondary | ICD-10-CM

## 2019-11-18 MED ORDER — SERTRALINE HCL 100 MG PO TABS: 150.0000 mg | ORAL_TABLET | Freq: Every day | ORAL | 0 refills | Status: DC

## 2019-11-18 NOTE — Progress Notes (Signed)
Cabell Baray [id number] [birth year] 58 y.o.  Virtual Visit via Telephone Note  I connected with pt on 11/18/19 at  8:00 AM EST by telephone and verified that I am speaking with the correct person using two identifiers.   I discussed the limitations, risks, security and privacy concerns of performing an evaluation and management service by telephone and the availability of in person appointments. I also discussed with the patient that there may be a patient responsible charge related to this service. The patient expressed understanding and agreed to proceed.   I discussed the assessment and treatment plan with the patient. The patient was provided an opportunity to ask questions and all were answered. The patient agreed with the plan and demonstrated an understanding of the instructions.   The patient was advised to call back or seek an in-person evaluation if the symptoms worsen or if the condition fails to improve as anticipated.  I provided 25 minutes of non-face-to-face time during this encounter.  The patient was located at home.  The provider was located at [place].   Thayer Headings, PMHNP   Subjective:   Patient ID:  Kenneth Orozco is a 58 y.o. (DOB July 12, 1961) male.  Chief Complaint:  Chief Complaint  Patient presents with  . Follow-up    Depression    HPI Kenneth Orozco presents for follow-up of depression and anxiety after increase in Sertraline in response to increased depression. "Overall I think I am doing better." He reports improved depressive s/s about 2-3 weeks after increase in Sertraline. He rates depression a 1-2 out of 10 with 10= most depressed. He reports that he is exercising every other day. Improved energy and motivation. Denies any significant worry or anxious thoughts. He reports that he has been trying to be more consistent with his bedtime. Denies difficulty falling or staying asleep. Denies appetite disturbance. Concentration has been adequate. Denies  SI.   Pt reports that his depressive episodes have always been triggered by work stress.   Father is hospitalized in [place] for depression. Father is 67 yo. Reports that when father has relapse in depressive s/s he has difficulty getting up due to low energy and motivation, with improved depressive s/s in the evening. Reports that father has been dx'd with Bipolar. Pt reports that father has h/o medication non-compliance.    Review of Systems:  Review of Systems  Gastrointestinal: Negative.   Musculoskeletal: Negative for gait problem.  Neurological: Negative for tremors.  Psychiatric/Behavioral:       Please refer to HPI    Medications: I have reviewed the patient's current medications.  Current Outpatient Medications  Medication Sig Dispense Refill  . Cholecalciferol (VITAMIN D3) 2000 UNITS capsule Take 1 capsule (2,000 Units total) by mouth daily. 100 capsule 3  . sertraline (ZOLOFT) 100 MG tablet Take 1.5 tablets (150 mg total) by mouth daily. 135 tablet 0  . simvastatin (ZOCOR) 40 MG tablet TAKE 1 TABLET AT BEDTIME 90 tablet 3  . traMADol (ULTRAM) 50 MG tablet Take 1 tablet (50 mg total) by mouth every 8 (eight) hours as needed. (Patient not taking: Reported on 08/04/2019) 15 tablet 0   No current facility-administered medications for this visit.    Medication Side Effects: None  Allergies: No Known Allergies  Past Medical History:  Diagnosis Date  . Depression   . Hyperlipidemia   . Hypertension   . Rectal bleeding     Family History  Problem Relation Age of Onset  . Cancer Father 32  prost ca  . Depression Father     Social History   Socioeconomic History  . Marital status: Married    Spouse name: Not on file  . Number of children: Not on file  . Years of education: Not on file  . Highest education level: Not on file  Occupational History  . Not on file  Tobacco Use  . Smoking status: Never Smoker  . Smokeless tobacco: Never Used  Substance and  Sexual Activity  . Alcohol use: No  . Drug use: No  . Sexual activity: Yes  Other Topics Concern  . Not on file  Social History Narrative  . Not on file   Social Determinants of Health   Financial Resource Strain:   . Difficulty of Paying Living Expenses: Not on file  Food Insecurity:   . Worried About Charity fundraiser in the Last Year: Not on file  . Ran Out of Food in the Last Year: Not on file  Transportation Needs:   . Lack of Transportation (Medical): Not on file  . Lack of Transportation (Non-Medical): Not on file  Physical Activity:   . Days of Exercise per Week: Not on file  . Minutes of Exercise per Session: Not on file  Stress:   . Feeling of Stress : Not on file  Social Connections:   . Frequency of Communication with Friends and Family: Not on file  . Frequency of Social Gatherings with Friends and Family: Not on file  . Attends Religious Services: Not on file  . Active Member of Clubs or Organizations: Not on file  . Attends Archivist Meetings: Not on file  . Marital Status: Not on file  Intimate Partner Violence:   . Fear of Current or Ex-Partner: Not on file  . Emotionally Abused: Not on file  . Physically Abused: Not on file  . Sexually Abused: Not on file    Past Medical History, Surgical history, Social history, and Family history were reviewed and updated as appropriate.   Please see review of systems for further details on the patient's review from today.   Objective:   Physical Exam:  Wt 182 lb (82.6 kg)   BMI 31.24 kg/m   Physical Exam Neurological:     Mental Status: He is alert and oriented to person, place, and time.     Cranial Nerves: No dysarthria.  Psychiatric:        Attention and Perception: Attention and perception normal.        Mood and Affect: Mood normal.        Speech: Speech normal.        Behavior: Behavior is cooperative.        Thought Content: Thought content normal. Thought content is not paranoid or  delusional. Thought content does not include homicidal or suicidal ideation. Thought content does not include homicidal or suicidal plan.        Cognition and Memory: Cognition and memory normal.        Judgment: Judgment normal.     Comments: Insight intact     Lab Review:     Component Value Date/Time   NA 139 08/04/2019 0817   K 3.7 08/04/2019 0817   CL 102 08/04/2019 0817   CO2 29 08/04/2019 0817   GLUCOSE 89 08/04/2019 0817   BUN 16 08/04/2019 0817   CREATININE 1.03 08/04/2019 0817   CALCIUM 9.6 08/04/2019 0817   PROT 7.2 08/04/2019 0817   ALBUMIN 4.4 08/04/2019  0817   AST 21 08/04/2019 0817   ALT 17 08/04/2019 0817   ALKPHOS 67 08/04/2019 0817   BILITOT 0.6 08/04/2019 0817   GFRNONAA 71.85 08/25/2010 0748   GFRAA 93 08/04/2008 1635       Component Value Date/Time   WBC 6.6 08/04/2019 0817   RBC 5.21 08/04/2019 0817   HGB 15.5 08/04/2019 0817   HCT 45.1 08/04/2019 0817   PLT 209.0 08/04/2019 0817   MCV 86.7 08/04/2019 0817   MCHC 34.4 08/04/2019 0817   RDW 13.4 08/04/2019 0817   LYMPHSABS 1.8 08/04/2019 0817   MONOABS 0.7 08/04/2019 0817   EOSABS 0.3 08/04/2019 0817   BASOSABS 0.1 08/04/2019 0817    No results found for: POCLITH, LITHIUM   No results found for: PHENYTOIN, PHENOBARB, VALPROATE, CBMZ   .res Assessment: Plan:   Pt seen for 30 minutes and greater than 50% of visit spent counseling pt re: how family hx may be helpful with determining other tx options in the future if needed. Discussed possibly resuming previous dose of Sertraline 150 mg po qd if s/s continue to be better controlled. Pt reports that he would like to continue Sertraline 200 mg po qd until current supply is depleted in late January. Pt reports that he will contact office if he has any worsening s/s before decreasing Sertraline from 200 mg to 150 mg po qd or following decrease. Pt to f/u in 3 months or sooner if clinically indicated.  Patient advised to contact office with any  questions, adverse effects, or acute worsening in signs and symptoms.  Kenneth Orozco was seen today for follow-up.  Diagnoses and all orders for this visit:  Mild episode of recurrent major depressive disorder (HCC) -     sertraline (ZOLOFT) 100 MG tablet; Take 1.5 tablets (150 mg total) by mouth daily.    Please see After Visit Summary for patient specific instructions.  Future Appointments  Date Time Provider [place]  02/18/2020  4:30 PM Thayer Headings, PMHNP CP-CP None    No orders of the defined types were placed in this encounter.     -------------------------------

## 2019-12-04 ENCOUNTER — Ambulatory Visit: Payer: BC Managed Care – PPO | Admitting: Psychiatry

## 2019-12-15 DIAGNOSIS — N400 Enlarged prostate without lower urinary tract symptoms: Secondary | ICD-10-CM | POA: Diagnosis not present

## 2019-12-16 ENCOUNTER — Ambulatory Visit: Payer: BC Managed Care – PPO

## 2019-12-21 DIAGNOSIS — R8271 Bacteriuria: Secondary | ICD-10-CM | POA: Diagnosis not present

## 2019-12-21 DIAGNOSIS — R3121 Asymptomatic microscopic hematuria: Secondary | ICD-10-CM | POA: Diagnosis not present

## 2019-12-21 DIAGNOSIS — R972 Elevated prostate specific antigen [PSA]: Secondary | ICD-10-CM | POA: Diagnosis not present

## 2019-12-25 ENCOUNTER — Ambulatory Visit: Payer: BC Managed Care – PPO | Attending: Internal Medicine

## 2019-12-25 DIAGNOSIS — Z23 Encounter for immunization: Secondary | ICD-10-CM | POA: Insufficient documentation

## 2019-12-25 NOTE — Progress Notes (Signed)
   Covid-19 Vaccination Clinic  Name:  Kenneth Orozco    MRN: [id number] DOB: 08-Jan-1961  12/25/2019  Mr. Kenneth Orozco was observed post Covid-19 immunization for 15 minutes without incidence. He was provided with Vaccine Information Sheet and instruction to access the V-Safe system.   Mr. Kenneth Orozco was instructed to call 911 with any severe reactions post vaccine: [name] Difficulty breathing  . Swelling of your face and throat  . A fast heartbeat  . A bad rash all over your body  . Dizziness and weakness    Immunizations Administered    Name Date Dose VIS Date Route   Pfizer COVID-19 Vaccine 12/25/2019  9:51 AM 0.3 mL 10/30/2019 Intramuscular   Manufacturer: [place]   Lot: [id number]   [place]: [id number]

## 2020-01-19 ENCOUNTER — Ambulatory Visit: Payer: BC Managed Care – PPO | Attending: Internal Medicine

## 2020-01-19 DIAGNOSIS — Z23 Encounter for immunization: Secondary | ICD-10-CM | POA: Insufficient documentation

## 2020-01-19 NOTE — Progress Notes (Signed)
   Covid-19 Vaccination Clinic  Name:  Kenneth Orozco    MRN: [id number] DOB: [birth year]  01/19/2020  Kenneth Orozco was observed post Covid-19 immunization for 15 minutes without incident. He was provided with Vaccine Information Sheet and instruction to access the V-Safe system.   Kenneth Orozco was instructed to call 911 with any severe reactions post vaccine: [name] Difficulty breathing  . Swelling of face and throat  . A fast heartbeat  . A bad rash all over body  . Dizziness and weakness   Immunizations Administered    Name Date Dose VIS Date Route   Pfizer COVID-19 Vaccine 01/19/2020 12:13 PM 0.3 mL 10/30/2019 Intramuscular   Manufacturer: [place]   Lot: [id number]   [place]: [id number]

## 2020-02-10 ENCOUNTER — Other Ambulatory Visit: Payer: Self-pay

## 2020-02-10 ENCOUNTER — Ambulatory Visit (INDEPENDENT_AMBULATORY_CARE_PROVIDER_SITE_OTHER): Payer: BC Managed Care – PPO | Admitting: Psychiatry

## 2020-02-10 ENCOUNTER — Encounter: Payer: Self-pay | Admitting: Psychiatry

## 2020-02-10 DIAGNOSIS — F3342 Major depressive disorder, recurrent, in full remission: Secondary | ICD-10-CM | POA: Diagnosis not present

## 2020-02-10 NOTE — Progress Notes (Signed)
Law Zara [id number] November 19, 1961 59 y.o.  Subjective:   Patient ID:  Kenneth Orozco is a 59 y.o. (DOB April 27, 1961) male.  Chief Complaint:  Chief Complaint  Patient presents with  . Follow-up    h/o depression    HPI Kenneth Orozco presents to the office today for follow-up of depression. He reports 3 weeks he had mild depressive s/s for about a week. He reports that he was still able to work, exercise, and do chores. He reports mood was lower than usual, about a 2.5-3 with 10= most depressed mood imaginable. He reports that his mood spontaneously improved. Mood has otherwise has been stable. Denies anxiety. He reports that he has been trying to go to bed earlier. Averages 6.5-7 hours a night. He reports energy is slightly low which he attributes to decreased sleep. Motivation and concentration have been good. Appetite has been good. Denies SI.   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   Reports that they went to see their children last weekend after not seeing them in 3 months since 2 of them have had their vaccinations.   Reports that father has Bipolar d/o and has had worsening s/s.    Review of Systems:  Review of Systems  Musculoskeletal: Positive for back pain. Negative for gait problem.  Neurological: Negative for tremors.  Psychiatric/Behavioral:       Please refer to HPI    Medications: I have reviewed the patient's current medications.  Current Outpatient Medications  Medication Sig Dispense Refill  . Cholecalciferol (VITAMIN D3) 2000 UNITS capsule Take 1 capsule (2,000 Units total) by mouth daily. 100 capsule 3  . sertraline (ZOLOFT) 100 MG tablet Take 1.5 tablets (150 mg total) by mouth daily. 135 tablet 0  . simvastatin (ZOCOR) 40 MG tablet TAKE 1 TABLET AT BEDTIME 90 tablet 3  . traMADol (ULTRAM) 50 MG tablet Take 1 tablet (50 mg total) by mouth every 8 (eight) hours as needed. (Patient not taking: Reported on 08/04/2019) 15  tablet 0   No current facility-administered medications for this visit.    Medication Side Effects: None  Allergies: No Known Allergies  Past Medical History:  Diagnosis Date  . Depression   . Hyperlipidemia   . Hypertension   . Rectal bleeding     Family History  Problem Relation Age of Onset  . Cancer Father 18       prost ca  . Depression Father     Social History   Socioeconomic History  . Marital status: Married    Spouse name: Not on file  . Number of children: Not on file  . Years of education: Not on file  . Highest education level: Not on file  Occupational History  . Not on file  Tobacco Use  . Smoking status: Never Smoker  . Smokeless tobacco: Never Used  Substance and Sexual Activity  . Alcohol use: No  . Drug use: No  . Sexual activity: Yes  Other Topics Concern  . Not on file  Social History Narrative  . Not on file   Social Determinants of Health   Financial Resource Strain:   . Difficulty of Paying Living Expenses:   Food Insecurity:   . Worried About Charity fundraiser in the Last Year:   . Arboriculturist in the Last Year:   Transportation Needs:   . Film/video editor (Medical):   [name] Lack of Transportation (Non-Medical):  Physical Activity:   . Days of Exercise per Week:   . Minutes of Exercise per Session:   Stress:   . Feeling of Stress :   Social Connections:   . Frequency of Communication with Friends and Family:   . Frequency of Social Gatherings with Friends and Family:   . Attends Religious Services:   . Active Member of Clubs or Organizations:   . Attends Archivist Meetings:   [name] Marital Status:   Intimate Partner Violence:   . Fear of Current or Ex-Partner:   . Emotionally Abused:   [name] Physically Abused:   . Sexually Abused:     Past Medical History, Surgical history, Social history, and Family history were reviewed and updated as appropriate.   Please see review of systems for further details on  the patient's review from today.   Objective:   Physical Exam:  There were no vitals taken for this visit.  Physical Exam Constitutional:      General: He is not in acute distress. Musculoskeletal:        General: No deformity.  Neurological:     Mental Status: He is alert and oriented to person, place, and time.     Coordination: Coordination normal.  Psychiatric:        Attention and Perception: Attention and perception normal. He does not perceive auditory or visual hallucinations.        Mood and Affect: Mood normal. Mood is not anxious or depressed. Affect is not labile, blunt, angry or inappropriate.        Speech: Speech normal.        Behavior: Behavior normal.        Thought Content: Thought content normal. Thought content is not paranoid or delusional. Thought content does not include homicidal or suicidal ideation. Thought content does not include homicidal or suicidal plan.        Cognition and Memory: Cognition and memory normal.        Judgment: Judgment normal.     Comments: Insight intact     Lab Review:     Component Value Date/Time   NA 139 08/04/2019 0817   K 3.7 08/04/2019 0817   CL 102 08/04/2019 0817   CO2 29 08/04/2019 0817   GLUCOSE 89 08/04/2019 0817   BUN 16 08/04/2019 0817   CREATININE 1.03 08/04/2019 0817   CALCIUM 9.6 08/04/2019 0817   PROT 7.2 08/04/2019 0817   ALBUMIN 4.4 08/04/2019 0817   AST 21 08/04/2019 0817   ALT 17 08/04/2019 0817   ALKPHOS 67 08/04/2019 0817   BILITOT 0.6 08/04/2019 0817   GFRNONAA 71.85 08/25/2010 0748   GFRAA 93 08/04/2008 1635       Component Value Date/Time   WBC 6.6 08/04/2019 0817   RBC 5.21 08/04/2019 0817   HGB 15.5 08/04/2019 0817   HCT 45.1 08/04/2019 0817   PLT 209.0 08/04/2019 0817   MCV 86.7 08/04/2019 0817   MCHC 34.4 08/04/2019 0817   RDW 13.4 08/04/2019 0817   LYMPHSABS 1.8 08/04/2019 0817   MONOABS 0.7 08/04/2019 0817   EOSABS 0.3 08/04/2019 0817   BASOSABS 0.1 08/04/2019 0817    No  results found for: POCLITH, LITHIUM   No results found for: PHENYTOIN, PHENOBARB, VALPROATE, CBMZ   .res Assessment: Plan:   Pt reports that recent depressive s/s were mild, brief in duration, and have resolved. Will therefore continue Sertraline 150 mg po qd for depression.  Pt to f/u in 6 months  or sooner if clinically indicated. Patient advised to contact office with any questions, adverse effects, or acute worsening in signs and symptoms.  Hylan was seen today for follow-up.  Diagnoses and all orders for this visit:  Recurrent major depressive disorder, in full remission [facility])     Please see After Visit Summary for patient specific instructions.  Future Appointments  Date Time Provider [place]  08/09/2020  4:00 PM Thayer Headings, PMHNP CP-CP None    No orders of the defined types were placed in this encounter.   -------------------------------

## 2020-02-18 ENCOUNTER — Ambulatory Visit: Payer: BC Managed Care – PPO | Admitting: Psychiatry

## 2020-03-09 DIAGNOSIS — L720 Epidermal cyst: Secondary | ICD-10-CM | POA: Diagnosis not present

## 2020-03-09 DIAGNOSIS — D225 Melanocytic nevi of trunk: Secondary | ICD-10-CM | POA: Diagnosis not present

## 2020-03-09 DIAGNOSIS — L821 Other seborrheic keratosis: Secondary | ICD-10-CM | POA: Diagnosis not present

## 2020-03-09 DIAGNOSIS — D2371 Other benign neoplasm of skin of right lower limb, including hip: Secondary | ICD-10-CM | POA: Diagnosis not present

## 2020-04-03 ENCOUNTER — Other Ambulatory Visit: Payer: Self-pay | Admitting: Psychiatry

## 2020-04-03 DIAGNOSIS — F330 Major depressive disorder, recurrent, mild: Secondary | ICD-10-CM

## 2020-07-12 DIAGNOSIS — N400 Enlarged prostate without lower urinary tract symptoms: Secondary | ICD-10-CM | POA: Diagnosis not present

## 2020-07-15 DIAGNOSIS — D176 Benign lipomatous neoplasm of spermatic cord: Secondary | ICD-10-CM | POA: Diagnosis not present

## 2020-07-15 DIAGNOSIS — N400 Enlarged prostate without lower urinary tract symptoms: Secondary | ICD-10-CM | POA: Diagnosis not present

## 2020-07-15 DIAGNOSIS — R972 Elevated prostate specific antigen [PSA]: Secondary | ICD-10-CM | POA: Diagnosis not present

## 2020-08-02 ENCOUNTER — Other Ambulatory Visit: Payer: Self-pay | Admitting: Urology

## 2020-08-02 DIAGNOSIS — R972 Elevated prostate specific antigen [PSA]: Secondary | ICD-10-CM

## 2020-08-09 ENCOUNTER — Other Ambulatory Visit: Payer: Self-pay

## 2020-08-09 ENCOUNTER — Encounter: Payer: Self-pay | Admitting: Psychiatry

## 2020-08-09 ENCOUNTER — Ambulatory Visit (INDEPENDENT_AMBULATORY_CARE_PROVIDER_SITE_OTHER): Payer: BC Managed Care – PPO | Admitting: Psychiatry

## 2020-08-09 ENCOUNTER — Encounter (INDEPENDENT_AMBULATORY_CARE_PROVIDER_SITE_OTHER): Payer: Self-pay

## 2020-08-09 DIAGNOSIS — F3341 Major depressive disorder, recurrent, in partial remission: Secondary | ICD-10-CM

## 2020-08-09 MED ORDER — SERTRALINE HCL 100 MG PO TABS: 150.0000 mg | ORAL_TABLET | Freq: Every day | ORAL | 0 refills | Status: DC

## 2020-08-09 NOTE — Progress Notes (Signed)
Kenneth Orozco [id number] 10-12-1961 59 y.o.  Subjective:   Patient ID:  Kenneth Orozco is a 59 y.o. (DOB 11/03/1961) male.  Chief Complaint:  Chief Complaint  Patient presents with  . Follow-up    h/o depression    HPI Kenneth Orozco presents to the office today for follow-up of depression. He reports he has been "overall ok." Continues to work remotely. He reports that his team has been reduced from 5 to 3 people. He reports that he and his family moved recently to a smaller place. He reports that they are now getting settled in the new place and are making repairs and getting previous home ready to sale. He reports that that the move was stressful. He reports that anxiety has been ok overall. He reports that he had period of lower mood for about 1.5 weeks and then mood improved. Denies any other depressive s/s since last visit. He reports that he had period of some irritability in response to work stress and setting up mortgage. He reports sleeping about 6 hours a night and needs to sleep more. Appetite has been ok. Energy and motivation have been ok. He reports that his concentration is adequate overall and will occasionally lose his train of thought. Denies SI.   Father is in [place]. Wife's parents are in [place].   Review of Systems:  Review of Systems  Musculoskeletal: Negative for gait problem.       Possible carpal tunnel syndrome  Neurological: Negative for tremors.       Occ very rare, slight tremor in left hand  Psychiatric/Behavioral:       Please refer to HPI    Medications: I have reviewed the patient's current medications.  Current Outpatient Medications  Medication Sig Dispense Refill  . Cholecalciferol (VITAMIN D3) 2000 UNITS capsule Take 1 capsule (2,000 Units total) by mouth daily. 100 capsule 3  . GARLIC PO Take by mouth.    . sertraline (ZOLOFT) 100 MG tablet Take 1.5 tablets (150 mg total) by mouth daily. 135 tablet 0  . simvastatin (ZOCOR) 40 MG tablet TAKE 1 TABLET AT  BEDTIME 90 tablet 3  . traMADol (ULTRAM) 50 MG tablet Take 1 tablet (50 mg total) by mouth every 8 (eight) hours as needed. (Patient not taking: Reported on 08/04/2019) 15 tablet 0   No current facility-administered medications for this visit.    Medication Side Effects: None  Allergies: No Known Allergies  Past Medical History:  Diagnosis Date  . Depression   . Hyperlipidemia   . Hypertension   . Rectal bleeding     Family History  Problem Relation Age of Onset  . Cancer Father 6       prost ca  . Depression Father     Social History   Socioeconomic History  . Marital status: Married    Spouse name: Not on file  . Number of children: Not on file  . Years of education: Not on file  . Highest education level: Not on file  Occupational History  . Not on file  Tobacco Use  . Smoking status: Never Smoker  . Smokeless tobacco: Never Used  Substance and Sexual Activity  . Alcohol use: No  . Drug use: No  . Sexual activity: Yes  Other Topics Concern  . Not on file  Social History Narrative  . Not on file   Social Determinants of Health   Financial Resource Strain:   . Difficulty of Paying Living Expenses: Not on file  Food Insecurity:   . Worried About Charity fundraiser in the Last Year: Not on file  . Ran Out of Food in the Last Year: Not on file  Transportation Needs:   . Lack of Transportation (Medical): Not on file  . Lack of Transportation (Non-Medical): Not on file  Physical Activity:   . Days of Exercise per Week: Not on file  . Minutes of Exercise per Session: Not on file  Stress:   . Feeling of Stress : Not on file  Social Connections:   . Frequency of Communication with Friends and Family: Not on file  . Frequency of Social Gatherings with Friends and Family: Not on file  . Attends Religious Services: Not on file  . Active Member of Clubs or Organizations: Not on file  . Attends Archivist Meetings: Not on file  . Marital Status: Not  on file  Intimate Partner Violence:   . Fear of Current or Ex-Partner: Not on file  . Emotionally Abused: Not on file  . Physically Abused: Not on file  . Sexually Abused: Not on file    Past Medical History, Surgical history, Social history, and Family history were reviewed and updated as appropriate.   Please see review of systems for further details on the patient's review from today.   Objective:   Physical Exam:  There were no vitals taken for this visit.  Physical Exam Constitutional:      General: He is not in acute distress. Musculoskeletal:        General: No deformity.  Neurological:     Mental Status: He is alert and oriented to person, place, and time.     Coordination: Coordination normal.  Psychiatric:        Attention and Perception: Attention and perception normal. He does not perceive auditory or visual hallucinations.        Mood and Affect: Mood normal. Mood is not anxious or depressed. Affect is not labile, blunt, angry or inappropriate.        Speech: Speech normal.        Behavior: Behavior normal.        Thought Content: Thought content normal. Thought content is not paranoid or delusional. Thought content does not include homicidal or suicidal ideation. Thought content does not include homicidal or suicidal plan.        Cognition and Memory: Cognition and memory normal.        Judgment: Judgment normal.     Comments: Insight intact     Lab Review:     Component Value Date/Time   NA 139 08/04/2019 0817   K 3.7 08/04/2019 0817   CL 102 08/04/2019 0817   CO2 29 08/04/2019 0817   GLUCOSE 89 08/04/2019 0817   BUN 16 08/04/2019 0817   CREATININE 1.03 08/04/2019 0817   CALCIUM 9.6 08/04/2019 0817   PROT 7.2 08/04/2019 0817   ALBUMIN 4.4 08/04/2019 0817   AST 21 08/04/2019 0817   ALT 17 08/04/2019 0817   ALKPHOS 67 08/04/2019 0817   BILITOT 0.6 08/04/2019 0817   GFRNONAA 71.85 08/25/2010 0748   GFRAA 93 08/04/2008 1635       Component Value  Date/Time   WBC 6.6 08/04/2019 0817   RBC 5.21 08/04/2019 0817   HGB 15.5 08/04/2019 0817   HCT 45.1 08/04/2019 0817   PLT 209.0 08/04/2019 0817   MCV 86.7 08/04/2019 0817   MCHC 34.4 08/04/2019 0817   RDW 13.4 08/04/2019 0817  LYMPHSABS 1.8 08/04/2019 0817   MONOABS 0.7 08/04/2019 0817   EOSABS 0.3 08/04/2019 0817   BASOSABS 0.1 08/04/2019 0817    No results found for: POCLITH, LITHIUM   No results found for: PHENYTOIN, PHENOBARB, VALPROATE, CBMZ   .res Assessment: Plan:   Continue Sertraline 150 mg po qd for depression. Pt to follow-up in 6 months or sooner if clinically indicated.  Patient advised to contact office with any questions, adverse effects, or acute worsening in signs and symptoms.  Kenneth Orozco was seen today for follow-up.  Diagnoses and all orders for this visit:  Recurrent major depressive disorder, in partial remission ([place]) -     sertraline (ZOLOFT) 100 MG tablet; Take 1.5 tablets (150 mg total) by mouth daily.     Please see After Visit Summary for patient specific instructions.  Future Appointments  Date Time Provider [place]  08/25/2020  4:00 PM GI-315 MR 3 [id number] [facility] [address] [facility]  02/07/2021  4:00 PM Thayer Headings, PMHNP CP-CP None    No orders of the defined types were placed in this encounter.   -------------------------------

## 2020-08-15 DIAGNOSIS — Z23 Encounter for immunization: Secondary | ICD-10-CM | POA: Diagnosis not present

## 2020-08-25 ENCOUNTER — Other Ambulatory Visit: Payer: Self-pay

## 2020-08-25 ENCOUNTER — Ambulatory Visit
Admission: RE | Admit: 2020-08-25 | Discharge: 2020-08-25 | Disposition: A | Discharge status: Home / Self Care | Payer: BC Managed Care – PPO | Source: Ambulatory Visit | Attending: Urology | Admitting: Urology

## 2020-08-25 DIAGNOSIS — N402 Nodular prostate without lower urinary tract symptoms: Secondary | ICD-10-CM | POA: Diagnosis not present

## 2020-08-25 DIAGNOSIS — R972 Elevated prostate specific antigen [PSA]: Secondary | ICD-10-CM

## 2020-08-25 IMAGING — MR MR PROSTATE WO/W CM
13 series · 48 of 48 positions shown · IV contrast (16ml Multihance)
Comparison: None.

CLINICAL DATA: Elevated PSA.

EXAM:
MR PROSTATE WITHOUT AND WITH CONTRAST
TECHNIQUE: Multiplanar multisequence MRI images were obtained of the pelvis
centered about the prostate. Pre and post contrast images were
obtained.
CONTRAST:  16mL MULTIHANCE GADOBENATE DIMEGLUMINE 529 MG/ML IV SOLN

[Series 2: new loc · axial · 6.0mm · 0.88mm/px · 1 of 17 slices shown]
[im 1/17]
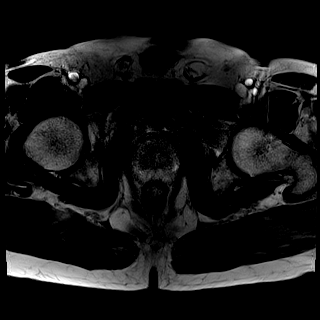

[Series 3: T2 · coronal · 3.0mm · 0.56mm/px · 1 of 23 slices shown (1 of 3)]
[im 1/23]
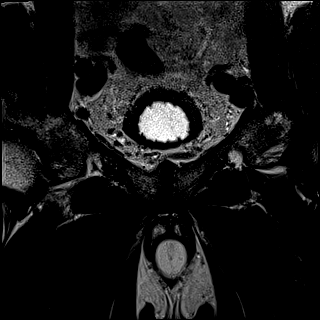

[Series 4: T1 · axial · 5.0mm · 1.25mm/px · 1 of 80 slices shown]
[im 1/80]
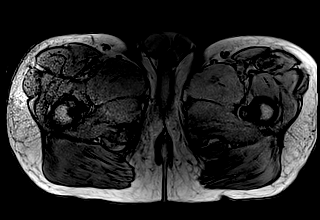

[Series 5: DWI · axial · 3.0mm · 1.75mm/px · z∈[-9,+57]mm · 2 of 69 slices shown (1 of 3)]
[im 1/69]
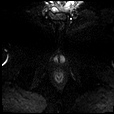
[im 69/69]
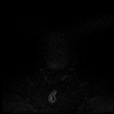

[Series 6: DWI · axial · 3.0mm · 1.75mm/px · 1 of 23 slices shown (2 of 3)]
[im 1/23]
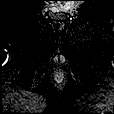

[Series 7: DWI · axial · 3.0mm · 1.75mm/px · 1 of 23 slices shown (3 of 3)]
[im 1/23]
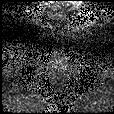

[Series 8: T2 · axial · 3.0mm · 0.56mm/px · 1 of 23 slices shown (2 of 3)]
[im 1/23]
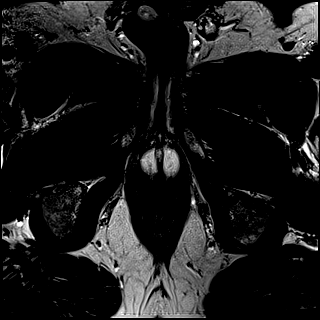

[Series 9: T2 · axial · 1.0mm · 1.04mm/px · z∈[-19,+60]mm · 2 of 80 slices shown (3 of 3)]
[im 1/80]
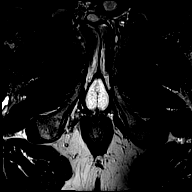
[im 80/80]
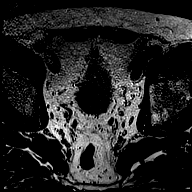

[Series 10: pre t1_twist_tra_dyn · axial · non-contrast · 3.5mm · 0.83mm/px · 1 of 20 slices shown]
[im 1/20]
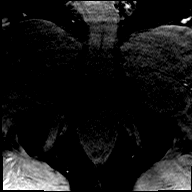

[Series 11: post t1_twist_tra_dyn-copy center · axial · non-contrast · 3.5mm · 0.83mm/px · z∈[-12,+55]mm · 17 of 600 slices shown]
[im 1/600]
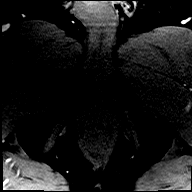
[im 38/600]
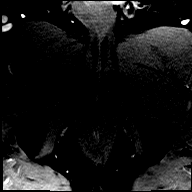
[im 75/600]
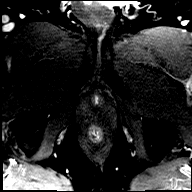
[im 113/600]
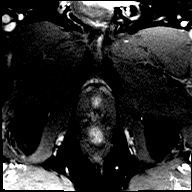
[im 150/600]
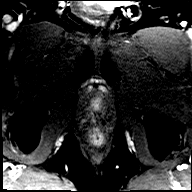
[im 188/600]
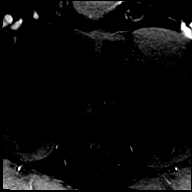
[im 225/600]
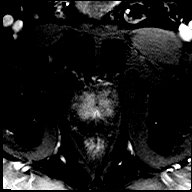
[im 263/600]
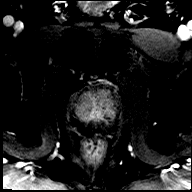
[im 300/600]
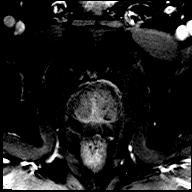
[im 337/600]
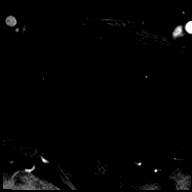
[im 375/600]
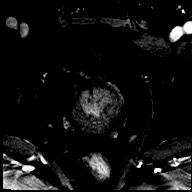
[im 412/600]
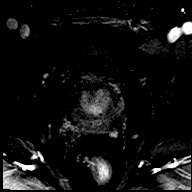
[im 450/600]
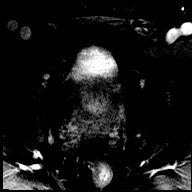
[im 487/600]
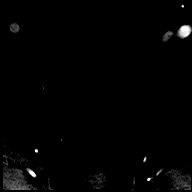
[im 525/600]
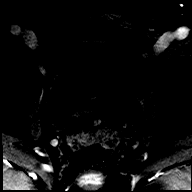
[im 562/600]
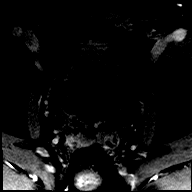
[im 600/600]
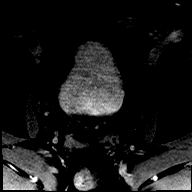

[Series 12: post t1_twist_tra_dyn-copy cent_sub · axial · 3.5mm · 0.83mm/px · z∈[-12,+55]mm · 16 of 573 slices shown]
[im 1/573]
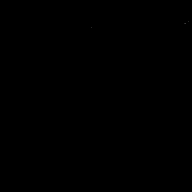
[im 39/573]
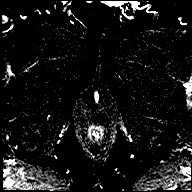
[im 77/573]
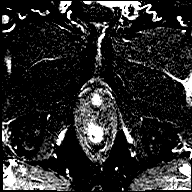
[im 115/573]
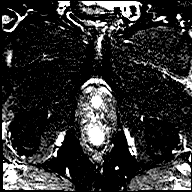
[im 153/573]
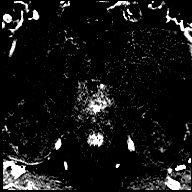
[im 191/573]
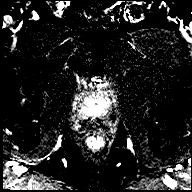
[im 229/573]
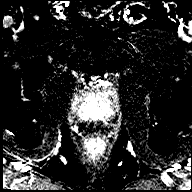
[im 267/573]
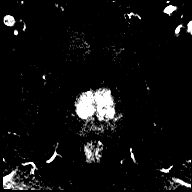
[im 306/573]
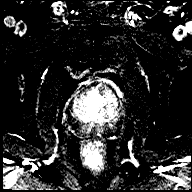
[im 344/573]
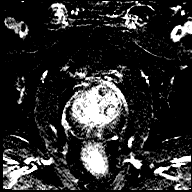
[im 382/573]
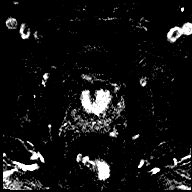
[im 420/573]
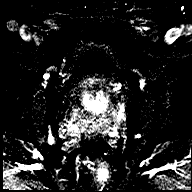
[im 458/573]
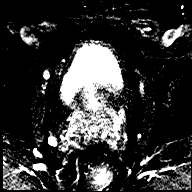
[im 496/573]
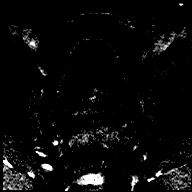
[im 534/573]
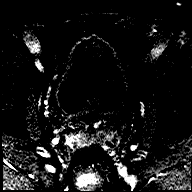
[im 573/573]
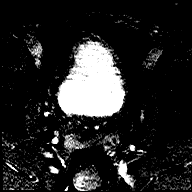

[Series 13: t1_vibe_dixon_tra_f · axial · 2.5mm · 0.91mm/px · z∈[-38,+159]mm · 2 of 80 slices shown]
[im 1/80]
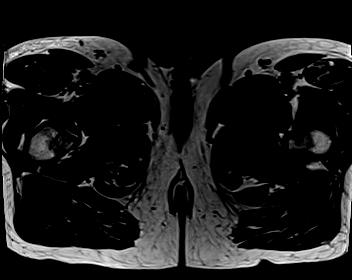
[im 80/80]
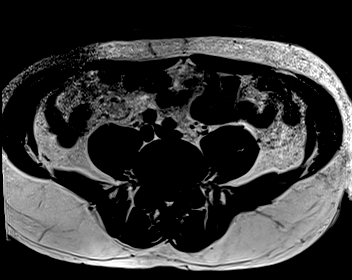

[Series 14: t1_vibe_dixon_tra_w · axial · 2.5mm · 0.91mm/px · z∈[-38,+159]mm · 2 of 80 slices shown]
[im 1/80]
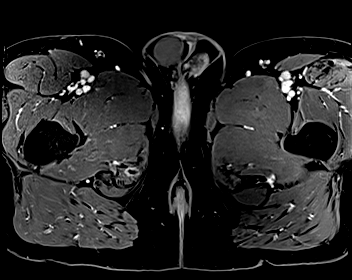
[im 80/80]
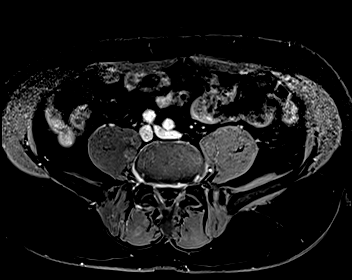

[48 of 48 positions shown; findings below may reference images not displayed]

FINDINGS: Prostate:

-- Peripheral Zone: No abnormality seen on ADC and high b-value DWI
sequences.

-- Transition/Central Zone: Small circumscribed BPH nodules are
noted, but no suspicious nodules with obscured or non-circumscribed
margins seen.

-- Measurements/Volume:  4.6 x 4.7 x 5.2 cm (volume = 59 cm^3)

Transcapsular spread:  Absent

Seminal vesicle involvement:  Absent

Neurovascular bundle involvement:  Absent

Pelvic adenopathy: None visualized

Bone metastasis: None visualized

Other:  None
IMPRESSION: No radiographic evidence of high-grade prostate carcinoma. PI-RADS
1: Very Low (clinically significant cancer is highly unlikely to be
present)

## 2020-08-25 MED ORDER — GADOBENATE DIMEGLUMINE 529 MG/ML IV SOLN
16.0000 mL | Freq: Once | INTRAVENOUS | Status: AC | PRN
  Administered 2020-08-25: 16 mL via INTRAVENOUS

## 2020-08-26 ENCOUNTER — Other Ambulatory Visit: Payer: BC Managed Care – PPO

## 2020-09-23 ENCOUNTER — Other Ambulatory Visit: Payer: Self-pay | Admitting: Internal Medicine

## 2020-11-30 DIAGNOSIS — Z1152 Encounter for screening for COVID-19: Secondary | ICD-10-CM | POA: Diagnosis not present

## 2020-12-06 ENCOUNTER — Other Ambulatory Visit: Payer: Self-pay | Admitting: Psychiatry

## 2020-12-06 DIAGNOSIS — F3341 Major depressive disorder, recurrent, in partial remission: Secondary | ICD-10-CM

## 2020-12-19 ENCOUNTER — Telehealth: Payer: Self-pay | Admitting: Internal Medicine

## 2020-12-19 MED ORDER — SIMVASTATIN 40 MG PO TABS: 40.0000 mg | ORAL_TABLET | Freq: Every day | ORAL | 0 refills | Status: DC

## 2020-12-19 NOTE — Telephone Encounter (Signed)
Per office policy sent 30 day to local pharmacy until appt.../lmb  

## 2020-12-19 NOTE — Telephone Encounter (Signed)
1.Medication Requested: simvastatin (ZOCOR) 40 MG tablet    2. Pharmacy (Name, Street, [place]):  [id number] - [place], [place]  3. On Med List: Yes   4. Last Visit with PCP:  9.15.2020  5. Next visit date with PCP: 2.9.22  Patient called and said that he is out of the above medication and was wondering if a short supply could be sent in until he sees Dr. Alain Marion.     Agent: Please be advised that RX refills may take up to 3 business days. We ask that you follow-up with your pharmacy.

## 2020-12-28 ENCOUNTER — Other Ambulatory Visit: Payer: Self-pay

## 2020-12-28 ENCOUNTER — Ambulatory Visit: Payer: BC Managed Care – PPO | Admitting: Internal Medicine

## 2020-12-28 ENCOUNTER — Encounter: Payer: Self-pay | Admitting: Internal Medicine

## 2020-12-28 VITALS — BP 112/80 | HR 66 | Temp 98.0°F | Ht 64.0 in | Wt 188.8 lb

## 2020-12-28 DIAGNOSIS — Z0000 Encounter for general adult medical examination without abnormal findings: Secondary | ICD-10-CM

## 2020-12-28 DIAGNOSIS — Z1211 Encounter for screening for malignant neoplasm of colon: Secondary | ICD-10-CM

## 2020-12-28 DIAGNOSIS — R202 Paresthesia of skin: Secondary | ICD-10-CM

## 2020-12-28 DIAGNOSIS — I10 Essential (primary) hypertension: Secondary | ICD-10-CM

## 2020-12-28 DIAGNOSIS — E785 Hyperlipidemia, unspecified: Secondary | ICD-10-CM | POA: Diagnosis not present

## 2020-12-28 LAB — CBC WITH DIFFERENTIAL/PLATELET
Basophils Absolute: 0 10*3/uL (ref 0.0–0.1)
Basophils Relative: 0.7 % (ref 0.0–3.0)
Eosinophils Absolute: 0.2 10*3/uL (ref 0.0–0.7)
Eosinophils Relative: 3.5 % (ref 0.0–5.0)
HCT: 44.2 % (ref 39.0–52.0)
Hemoglobin: 15.2 g/dL (ref 13.0–17.0)
Lymphocytes Relative: 24 % (ref 12.0–46.0)
Lymphs Abs: 1.6 10*3/uL (ref 0.7–4.0)
MCHC: 34.3 g/dL (ref 30.0–36.0)
MCV: 86.3 fl (ref 78.0–100.0)
Monocytes Absolute: 0.7 10*3/uL (ref 0.1–1.0)
Monocytes Relative: 11 % (ref 3.0–12.0)
Neutro Abs: 4 10*3/uL (ref 1.4–7.7)
Neutrophils Relative %: 60.8 % (ref 43.0–77.0)
Platelets: 179 10*3/uL (ref 150.0–400.0)
RBC: 5.13 Mil/uL (ref 4.22–5.81)
RDW: 13.5 % (ref 11.5–15.5)
WBC: 6.6 10*3/uL (ref 4.0–10.5)

## 2020-12-28 LAB — COMPREHENSIVE METABOLIC PANEL
ALT: 19 U/L (ref 0–53)
AST: 24 U/L (ref 0–37)
Albumin: 4.2 g/dL (ref 3.5–5.2)
Alkaline Phosphatase: 71 U/L (ref 39–117)
BUN: 17 mg/dL (ref 6–23)
CO2: 30 mEq/L (ref 19–32)
Calcium: 9.5 mg/dL (ref 8.4–10.5)
Chloride: 105 mEq/L (ref 96–112)
Creatinine, Ser: 1.11 mg/dL (ref 0.40–1.50)
GFR: 72.79 mL/min (ref >60.00)
Glucose, Bld: 87 mg/dL (ref 70–99)
Potassium: 3.9 mEq/L (ref 3.5–5.1)
Sodium: 140 mEq/L (ref 135–145)
Total Bilirubin: 0.6 mg/dL (ref 0.2–1.2)
Total Protein: 6.6 g/dL (ref 6.0–8.3)

## 2020-12-28 LAB — URINALYSIS
Bilirubin Urine: NEGATIVE
Hgb urine dipstick: NEGATIVE
Ketones, ur: NEGATIVE
Leukocytes,Ua: NEGATIVE
Nitrite: NEGATIVE
Specific Gravity, Urine: >=1.03 — AB (ref 1.000–1.030)
Total Protein, Urine: NEGATIVE
Urine Glucose: NEGATIVE
Urobilinogen, UA: 0.2 (ref 0.0–1.0)
pH: 6 (ref 5.0–8.0)

## 2020-12-28 LAB — LIPID PANEL
Cholesterol: 179 mg/dL (ref 0–200)
HDL: 62.8 mg/dL (ref >39.00)
LDL Cholesterol: 99 mg/dL (ref 0–99)
NonHDL: 116.08
Total CHOL/HDL Ratio: 3
Triglycerides: 83 mg/dL (ref 0.0–149.0)
VLDL: 16.6 mg/dL (ref 0.0–40.0)

## 2020-12-28 LAB — TSH: TSH: 1.29 u[IU]/mL (ref 0.35–4.50)

## 2020-12-28 LAB — VITAMIN B12: Vitamin B-12: 363 pg/mL (ref 211–911)

## 2020-12-28 LAB — PSA: PSA: 4.63 ng/mL — ABNORMAL HIGH (ref 0.10–4.00)

## 2020-12-28 MED ORDER — SIMVASTATIN 40 MG PO TABS
40.0000 mg | ORAL_TABLET | Freq: Every day | ORAL | 3 refills | Status: DC
Start: 2020-12-28 — End: 2021-12-25

## 2020-12-28 NOTE — Assessment & Plan Note (Signed)
BP Readings from Last 3 Encounters:  12/28/20 112/80  08/04/19 126/78  01/06/18 120/76

## 2020-12-28 NOTE — Assessment & Plan Note (Addendum)
  We discussed age appropriate health related issues, including available/recomended screening tests and vaccinations. Labs were ordered to be later reviewed . All questions were answered. We discussed one or more of the following - seat belt use, use of sunscreen/sun exposure exercise, safe sex, fall risk reduction, second hand smoke exposure, firearm use and storage, seat belt use, a need for adhering to healthy diet and exercise. Labs were ordered.  All questions were answered. A cardiac CT scan for coronary calcium offered -ordered Dr Collene Mares said colon is due in 2022-referral was made. PSA per Dr Diona Fanti

## 2020-12-28 NOTE — Patient Instructions (Addendum)
Cardiac CT calcium scoring test $150 Tel # is [phone]   Computed tomography, more commonly known as a CT or CAT scan, is a diagnostic medical imaging test. Like traditional x-rays, it produces multiple images or pictures of the inside of the body. The cross-sectional images generated during a CT scan can be reformatted in multiple planes. They can even generate three-dimensional images. These images can be viewed on a computer monitor, printed on film or by a 3D printer, or transferred to a CD or DVD. CT images of internal organs, bones, soft tissue and blood vessels provide greater detail than traditional x-rays, particularly of soft tissues and blood vessels. A cardiac CT scan for coronary calcium is a non-invasive way of obtaining information about the presence, location and extent of calcified plaque in the coronary arteries--the vessels that supply oxygen-containing blood to the heart muscle. Calcified plaque results when there is a build-up of fat and other substances under the inner layer of the artery. This material can calcify which signals the presence of atherosclerosis, a disease of the vessel wall, also called coronary artery disease (CAD). People with this disease have an increased risk for heart attacks. In addition, over time, progression of plaque build up (CAD) can narrow the arteries or even close off blood flow to the heart. The result may be chest pain, sometimes called "angina," or a heart attack. Because calcium is a marker of CAD, the amount of calcium detected on a cardiac CT scan is a helpful prognostic tool. The findings on cardiac CT are expressed as a calcium score. Another name for this test is coronary artery calcium scoring.  What are some common uses of the procedure? The goal of cardiac CT scan for calcium scoring is to determine if CAD is present and to what extent, even if there are no symptoms. It is a screening study that may be recommended by a physician for  patients with risk factors for CAD but no clinical symptoms. The major risk factors for CAD are: . high blood cholesterol levels  . family history of heart attacks  . diabetes  . high blood pressure  . cigarette smoking  . overweight or obese  . physical inactivity   A negative cardiac CT scan for calcium scoring shows no calcification within the coronary arteries. This suggests that CAD is absent or so minimal it cannot be seen by this technique. The chance of having a heart attack over the next two to five years is very low under these circumstances. A positive test means that CAD is present, regardless of whether or not the patient is experiencing any symptoms. The amount of calcification--expressed as the calcium score--may help to predict the likelihood of a myocardial infarction (heart attack) in the coming years and helps your medical doctor or cardiologist decide whether the patient may need to take preventive medicine or undertake other measures such as diet and exercise to lower the risk for heart attack. The extent of CAD is graded according to your calcium score:  Calcium Score  Presence of CAD (coronary artery disease)  0 No evidence of CAD   1-10 Minimal evidence of CAD  11-100 Mild evidence of CAD  101-400 Moderate evidence of CAD  Over 400 Extensive evidence of CAD      Carpal Tunnel Syndrome  Carpal tunnel syndrome is a condition that causes pain, weakness, and numbness in your hand and arm. Numbness is when you cannot feel an area in your body. The carpal tunnel  is a narrow area that is on the palm side of your wrist. Repeated wrist motion or certain diseases may cause swelling in the tunnel. This swelling can pinch the main nerve in the wrist. This nerve is called the median nerve. What are the causes? This condition may be caused by:  Moving your hand and wrist over and over again while doing a task.  Injury to the wrist.  Arthritis.  A sac of fluid (cyst) or  abnormal growth (tumor) in the carpal tunnel.  Fluid buildup during pregnancy.  Use of tools that vibrate. Sometimes the cause is not known. What increases the risk? The following factors may make you more likely to have this condition:  Having a job that makes you do these things: ? Move your hand over and over again. ? Work with tools that vibrate, such as drills or sanders.  Being a woman.  Having diabetes, obesity, thyroid problems, or kidney failure. What are the signs or symptoms? Symptoms of this condition include:  A tingling feeling in your fingers.  Tingling or loss of feeling in your hand.  Pain in your entire arm. This pain may get worse when you bend your wrist and elbow for a long time.  Pain in your wrist that goes up your arm to your shoulder.  Pain that goes down into your palm or fingers.  Weakness in your hands. You may find it hard to grab and hold items. You may feel worse at night. How is this treated? This condition may be treated with:  Lifestyle changes. You will be asked to stop or change the activity that caused your problem.  Doing exercises and activities that make bones, muscles, and tendons stronger (physical therapy).  Learning how to use your hand again (occupational therapy).  Medicines for pain and swelling. You may have injections in your wrist.  A wrist splint or brace.  Surgery. Follow these instructions at home: If you have a splint or brace:  Wear the splint or brace as told by your doctor. Take it off only as told by your doctor.  Loosen the splint if your fingers: ? Tingle. ? Become numb. ? Turn cold and blue.  Keep the splint or brace clean.  If the splint or brace is not waterproof: ? Do not let it get wet. ? Cover it with a watertight covering when you take a bath or a shower. Managing pain, stiffness, and swelling If told, put ice on the painful area:  If you have a removable splint or brace, remove it as  told by your doctor.  Put ice in a plastic bag.  Place a towel between your skin and the bag.  Leave the ice on for 20 minutes, 2-3 times per day. Do not fall asleep with the cold pack on your skin.  Take off the ice if your skin turns bright red. This is very important. If you cannot feel pain, heat, or cold, you have a greater risk of damage to the area. Move your fingers often to reduce stiffness and swelling.   General instructions  Take over-the-counter and prescription medicines only as told by your doctor.  Rest your wrist from any activity that may cause pain. If needed, talk with your boss at work about changes that can help your wrist heal.  Do exercises as told by your doctor, physical therapist, or occupational therapist.  Keep all follow-up visits. Contact a doctor if:  You have new symptoms.  Medicine does  not help your pain.  Your symptoms get worse. Get help right away if:  You have very bad numbness or tingling in your wrist or hand. Summary  Carpal tunnel syndrome is a condition that causes pain in your hand and arm.  It is often caused by repeated wrist motions.  Lifestyle changes and medicines are used to treat this problem. Surgery may help in very bad cases.  Follow your doctor's instructions about wearing a splint, resting your wrist, keeping follow-up visits, and calling for help. This information is not intended to replace advice given to you by your health care provider. Make sure you discuss any questions you have with your health care provider. Document Revised: 03/17/2020 Document Reviewed: 03/17/2020 Elsevier Patient Education  [place].

## 2020-12-28 NOTE — Progress Notes (Signed)
Subjective:  [name], male    DOB: Apr 16, 1961  Age: 60 y.o. MRN: [id number]  CC: Follow-up (Requesting refill for his Simvastatin)   HPI Kenneth Orozco presents for a well exam C/o R CTS sx's when driving  Outpatient Medications Prior to Visit  Medication Sig Dispense Refill  . Cholecalciferol (VITAMIN D3) 2000 UNITS capsule Take 1 capsule (2,000 Units total) by mouth daily. 100 capsule 3  . GARLIC PO Take by mouth.    . sertraline (ZOLOFT) 100 MG tablet TAKE ONE AND ONE-HALF TABLETS DAILY 135 tablet 3  . simvastatin (ZOCOR) 40 MG tablet Take 1 tablet (40 mg total) by mouth at bedtime. Keep scheduled appt for future refills 30 tablet 0  . traMADol (ULTRAM) 50 MG tablet Take 1 tablet (50 mg total) by mouth every 8 (eight) hours as needed. (Patient not taking: Reported on 08/04/2019) 15 tablet 0   No facility-administered medications prior to visit.    ROS: Review of Systems  Constitutional: Negative for appetite change, fatigue and unexpected weight change.  HENT: Negative for congestion, nosebleeds, sneezing, sore throat and trouble swallowing.   Eyes: Negative for itching and visual disturbance.  Respiratory: Negative for cough.   Cardiovascular: Negative for chest pain, palpitations and leg swelling.  Gastrointestinal: Negative for abdominal distention, blood in stool, diarrhea and nausea.  Genitourinary: Negative for frequency and hematuria.  Musculoskeletal: Negative for back pain, gait problem, joint swelling and neck pain.  Skin: Negative for rash.  Neurological: Negative for dizziness, tremors, speech difficulty and weakness.  Psychiatric/Behavioral: Negative for agitation, dysphoric mood and sleep disturbance. The patient is not nervous/anxious.     Objective:  BP 112/80 (BP Location: Left Arm)   Pulse 66   Temp 98 F (36.7 C) (Oral)   Ht 5\' 4"  (1.626 m)   Wt 188 lb 12.8 oz (85.6 kg)   SpO2 95%   BMI 32.41 kg/m   BP Readings from Last 3 Encounters:   12/28/20 112/80  08/04/19 126/78  01/06/18 120/76    Wt Readings from Last 3 Encounters:  12/28/20 188 lb 12.8 oz (85.6 kg)  08/04/19 187 lb (84.8 kg)  06/05/18 177 lb (80.3 kg)    Physical Exam Constitutional:      General: He is not in acute distress.    Appearance: He is well-developed.     Comments: NAD  HENT:     Mouth/Throat:     Mouth: Oropharynx is clear and moist.  Eyes:     Conjunctiva/sclera: Conjunctivae normal.     Pupils: Pupils are equal, round, and reactive to light.  Neck:     Thyroid: No thyromegaly.     Vascular: No JVD.  Cardiovascular:     Rate and Rhythm: Normal rate and regular rhythm.     Pulses: Intact distal pulses.     Heart sounds: Normal heart sounds. No murmur heard. No friction rub. No gallop.   Pulmonary:     Effort: Pulmonary effort is normal. No respiratory distress.     Breath sounds: Normal breath sounds. No wheezing or rales.  Chest:     Chest wall: No tenderness.  Abdominal:     General: Bowel sounds are normal. There is no distension.     Palpations: Abdomen is soft. There is no mass.     Tenderness: There is no abdominal tenderness. There is no guarding or rebound.  Musculoskeletal:        General: No tenderness or edema. Normal range of motion.  Cervical back: Normal range of motion.  Lymphadenopathy:     Cervical: No cervical adenopathy.  Skin:    General: Skin is warm and dry.     Findings: No rash.  Neurological:     Mental Status: He is alert and oriented to person, place, and time.     Cranial Nerves: No cranial nerve deficit.     Motor: No abnormal muscle tone.     Coordination: He displays a negative Romberg sign. Coordination normal.     Gait: Gait normal.     Deep Tendon Reflexes: Reflexes are normal and symmetric.  Psychiatric:        Mood and Affect: Mood and affect normal.        Behavior: Behavior normal.        Thought Content: Thought content normal.        Judgment: Judgment normal.   CTS signs  are negative bilaterally  Lab Results  Component Value Date   WBC 6.6 08/04/2019   HGB 15.5 08/04/2019   HCT 45.1 08/04/2019   PLT 209.0 08/04/2019   GLUCOSE 89 08/04/2019   CHOL 209 (H) 08/04/2019   TRIG 96.0 08/04/2019   HDL 63.90 08/04/2019   LDLDIRECT 145.6 08/01/2012   LDLCALC 126 (H) 08/04/2019   ALT 17 08/04/2019   AST 21 08/04/2019   NA 139 08/04/2019   K 3.7 08/04/2019   CL 102 08/04/2019   CREATININE 1.03 08/04/2019   BUN 16 08/04/2019   CO2 29 08/04/2019   TSH 1.54 08/04/2019   PSA 2.61 08/25/2010    MR PROSTATE W WO CONTRAST  Result Date: 08/25/2020 CLINICAL DATA:  Elevated PSA. EXAM: MR PROSTATE WITHOUT AND WITH CONTRAST TECHNIQUE: Multiplanar multisequence MRI images were obtained of the pelvis centered about the prostate. Pre and post contrast images were obtained. CONTRAST:  80mL MULTIHANCE GADOBENATE DIMEGLUMINE 529 MG/ML IV SOLN COMPARISON:  None. FINDINGS: Prostate: -- Peripheral Zone: No abnormality seen on ADC and high b-value DWI sequences. -- Transition/Central Zone: Small circumscribed BPH nodules are noted, but no suspicious nodules with obscured or non-circumscribed margins seen. -- Measurements/Volume:  4.6 x 4.7 x 5.2 cm (volume = 59 cm^3) Transcapsular spread:  Absent Seminal vesicle involvement:  Absent Neurovascular bundle involvement:  Absent Pelvic adenopathy: None visualized Bone metastasis: None visualized Other:  None IMPRESSION: No radiographic evidence of high-grade prostate carcinoma. PI-RADS 1: Very Low (clinically significant cancer is highly unlikely to be present) Electronically Signed   By: Marlaine Hind M.D.   On: 08/25/2020 18:39    Assessment & Plan:    Walker Kehr, MD

## 2021-01-01 NOTE — Assessment & Plan Note (Signed)
Coronary calcium CT ordered Currently on simvastatin

## 2021-01-19 ENCOUNTER — Encounter: Payer: Self-pay | Admitting: Internal Medicine

## 2021-01-23 DIAGNOSIS — N400 Enlarged prostate without lower urinary tract symptoms: Secondary | ICD-10-CM | POA: Diagnosis not present

## 2021-01-30 DIAGNOSIS — N400 Enlarged prostate without lower urinary tract symptoms: Secondary | ICD-10-CM | POA: Diagnosis not present

## 2021-01-30 DIAGNOSIS — R972 Elevated prostate specific antigen [PSA]: Secondary | ICD-10-CM | POA: Diagnosis not present

## 2021-02-07 ENCOUNTER — Encounter: Payer: Self-pay | Admitting: Psychiatry

## 2021-02-07 ENCOUNTER — Ambulatory Visit (INDEPENDENT_AMBULATORY_CARE_PROVIDER_SITE_OTHER): Payer: BC Managed Care – PPO | Admitting: Psychiatry

## 2021-02-07 ENCOUNTER — Other Ambulatory Visit: Payer: Self-pay

## 2021-02-07 DIAGNOSIS — F3342 Major depressive disorder, recurrent, in full remission: Secondary | ICD-10-CM

## 2021-02-07 NOTE — Progress Notes (Signed)
Eagan Shifflett [id number] 1961-04-04 60 y.o.  Subjective:   Patient ID:  Kenneth Orozco is a 60 y.o. (DOB 1961-02-03) male.  Chief Complaint:  Chief Complaint  Patient presents with  . Depression    HPI Kenneth Orozco presents to the office today for follow-up of depression. He reports that he lost his father in late December and had just seen him in mid-December.  He reports father's death was unexpected. He was able to attend father's funeral.   Denies depressed mood. Denies anxiety. He reports that he has been tired "because I work too much and sleep too little." He reports that he is often staying up until 1 am. Sleeping 6-7 hours a night. He reports appetite has been good. Motivation has been good. No change in concentration. Denies SI.   He is playing soccer. He reports that they are enjoying new home. Works remotely.   [place] [facility] Visit from 12/28/2020 in [place] at [facility] Total Score 0  PHQ-9 Total Score 0       Review of Systems:  Review of Systems  Gastrointestinal: Negative.   Musculoskeletal: Negative for gait problem.  Neurological: Negative for tremors.       He reports that he has some dizziness and shortness of breath for a few seconds when going upstairs. Denies dizziness or shortness of breath with other exertion  Psychiatric/Behavioral:       Please refer to HPI    Medications: I have reviewed the patient's current medications.  Current Outpatient Medications  Medication Sig Dispense Refill  . Cholecalciferol (VITAMIN D3) 2000 UNITS capsule Take 1 capsule (2,000 Units total) by mouth daily. 100 capsule 3  . Cyanocobalamin (VITAMIN B 12 PO) Take by mouth.    [name] GARLIC PO Take by mouth.    . sertraline (ZOLOFT) 100 MG tablet TAKE ONE AND ONE-HALF TABLETS DAILY 135 tablet 3  . simvastatin (ZOCOR) 40 MG tablet Take 1 tablet (40 mg total) by mouth at bedtime. Keep scheduled appt for future refills 90 tablet 3   No current  facility-administered medications for this visit.    Medication Side Effects: None  Allergies: No Known Allergies  Past Medical History:  Diagnosis Date  . Depression   . Hyperlipidemia   . Hypertension   . Rectal bleeding     Family History  Problem Relation Age of Onset  . Cancer Father 24       prost ca  . Depression Father     Social History   Socioeconomic History  . Marital status: Married    Spouse name: Not on file  . Number of children: Not on file  . Years of education: Not on file  . Highest education level: Not on file  Occupational History  . Not on file  Tobacco Use  . Smoking status: Never Smoker  . Smokeless tobacco: Never Used  Substance and Sexual Activity  . Alcohol use: No  . Drug use: No  . Sexual activity: Yes  Other Topics Concern  . Not on file  Social History Narrative  . Not on file   Social Determinants of Health   Financial Resource Strain: Not on file  Food Insecurity: Not on file  Transportation Needs: Not on file  Physical Activity: Not on file  Stress: Not on file  Social Connections: Not on file  Intimate Partner Violence: Not on file    Past Medical History, Surgical history, Social history, and Family history  were reviewed and updated as appropriate.   Please see review of systems for further details on the patient's review from today.   Objective:   Physical Exam:  There were no vitals taken for this visit.  Physical Exam Constitutional:      General: He is not in acute distress. Musculoskeletal:        General: No deformity.  Neurological:     Mental Status: He is alert and oriented to person, place, and time.     Coordination: Coordination normal.  Psychiatric:        Attention and Perception: Attention and perception normal. He does not perceive auditory or visual hallucinations.        Mood and Affect: Mood normal. Mood is not anxious or depressed. Affect is not labile, blunt, angry or inappropriate.         Speech: Speech normal.        Behavior: Behavior normal.        Thought Content: Thought content normal. Thought content is not paranoid or delusional. Thought content does not include homicidal or suicidal ideation. Thought content does not include homicidal or suicidal plan.        Cognition and Memory: Cognition and memory normal.        Judgment: Judgment normal.     Comments: Insight intact     Lab Review:     Component Value Date/Time   NA 140 12/28/2020 0831   K 3.9 12/28/2020 0831   CL 105 12/28/2020 0831   CO2 30 12/28/2020 0831   GLUCOSE 87 12/28/2020 0831   BUN 17 12/28/2020 0831   CREATININE 1.11 12/28/2020 0831   CALCIUM 9.5 12/28/2020 0831   PROT 6.6 12/28/2020 0831   ALBUMIN 4.2 12/28/2020 0831   AST 24 12/28/2020 0831   ALT 19 12/28/2020 0831   ALKPHOS 71 12/28/2020 0831   BILITOT 0.6 12/28/2020 0831   GFRNONAA 71.85 08/25/2010 0748   GFRAA 93 08/04/2008 1635       Component Value Date/Time   WBC 6.6 12/28/2020 0831   RBC 5.13 12/28/2020 0831   HGB 15.2 12/28/2020 0831   HCT 44.2 12/28/2020 0831   PLT 179.0 12/28/2020 0831   MCV 86.3 12/28/2020 0831   MCHC 34.3 12/28/2020 0831   RDW 13.5 12/28/2020 0831   LYMPHSABS 1.6 12/28/2020 0831   MONOABS 0.7 12/28/2020 0831   EOSABS 0.2 12/28/2020 0831   BASOSABS 0.0 12/28/2020 0831    No results found for: POCLITH, LITHIUM   No results found for: PHENYTOIN, PHENOBARB, VALPROATE, CBMZ   .res Assessment: Plan:    Continue sertraline 150 mg daily for depressive signs and symptoms. Patient to follow-up in 6 months or sooner if clinically indicated. Patient advised to contact office with any questions, adverse effects, or acute worsening in signs and symptoms.   Kenneth Orozco was seen today for depression.  Diagnoses and all orders for this visit:  Recurrent major depressive disorder, in full remission [facility])     Please see After Visit Summary for patient specific instructions.  Future Appointments   Date Time Provider [place]  08/10/2021  4:00 PM Thayer Headings, PMHNP [facility] None    No orders of the defined types were placed in this encounter.   -------------------------------

## 2021-02-21 DIAGNOSIS — Z1211 Encounter for screening for malignant neoplasm of colon: Secondary | ICD-10-CM | POA: Diagnosis not present

## 2021-02-21 DIAGNOSIS — K219 Gastro-esophageal reflux disease without esophagitis: Secondary | ICD-10-CM | POA: Diagnosis not present

## 2021-02-21 DIAGNOSIS — K5730 Diverticulosis of large intestine without perforation or abscess without bleeding: Secondary | ICD-10-CM | POA: Diagnosis not present

## 2021-02-21 DIAGNOSIS — E669 Obesity, unspecified: Secondary | ICD-10-CM | POA: Diagnosis not present

## 2021-02-22 ENCOUNTER — Other Ambulatory Visit: Payer: Self-pay

## 2021-02-22 ENCOUNTER — Ambulatory Visit (INDEPENDENT_AMBULATORY_CARE_PROVIDER_SITE_OTHER)
Admission: RE | Admit: 2021-02-22 | Discharge: 2021-02-22 | Disposition: A | Discharge status: Home / Self Care | Payer: Self-pay | Source: Ambulatory Visit | Attending: Internal Medicine | Admitting: Internal Medicine

## 2021-02-22 DIAGNOSIS — E785 Hyperlipidemia, unspecified: Secondary | ICD-10-CM

## 2021-02-23 DIAGNOSIS — N400 Enlarged prostate without lower urinary tract symptoms: Secondary | ICD-10-CM | POA: Diagnosis not present

## 2021-07-05 DIAGNOSIS — K5730 Diverticulosis of large intestine without perforation or abscess without bleeding: Secondary | ICD-10-CM | POA: Diagnosis not present

## 2021-07-05 DIAGNOSIS — Z1211 Encounter for screening for malignant neoplasm of colon: Secondary | ICD-10-CM | POA: Diagnosis not present

## 2021-07-05 DIAGNOSIS — K2970 Gastritis, unspecified, without bleeding: Secondary | ICD-10-CM | POA: Diagnosis not present

## 2021-07-05 DIAGNOSIS — K2100 Gastro-esophageal reflux disease with esophagitis, without bleeding: Secondary | ICD-10-CM | POA: Diagnosis not present

## 2021-07-05 DIAGNOSIS — K635 Polyp of colon: Secondary | ICD-10-CM | POA: Diagnosis not present

## 2021-07-05 DIAGNOSIS — K449 Diaphragmatic hernia without obstruction or gangrene: Secondary | ICD-10-CM | POA: Diagnosis not present

## 2021-07-05 DIAGNOSIS — D124 Benign neoplasm of descending colon: Secondary | ICD-10-CM | POA: Diagnosis not present

## 2021-07-05 DIAGNOSIS — D125 Benign neoplasm of sigmoid colon: Secondary | ICD-10-CM | POA: Diagnosis not present

## 2021-07-13 ENCOUNTER — Telehealth: Payer: Self-pay | Admitting: *Deleted

## 2021-07-13 ENCOUNTER — Encounter: Payer: Self-pay | Admitting: Internal Medicine

## 2021-07-13 NOTE — Telephone Encounter (Signed)
Abstracted EGD results.Marland KitchenJohny Orozco

## 2021-08-10 ENCOUNTER — Ambulatory Visit (INDEPENDENT_AMBULATORY_CARE_PROVIDER_SITE_OTHER): Payer: BC Managed Care – PPO | Admitting: Psychiatry

## 2021-08-10 ENCOUNTER — Encounter: Payer: Self-pay | Admitting: Psychiatry

## 2021-08-10 ENCOUNTER — Other Ambulatory Visit: Payer: Self-pay

## 2021-08-10 DIAGNOSIS — F3341 Major depressive disorder, recurrent, in partial remission: Secondary | ICD-10-CM

## 2021-08-10 MED ORDER — SERTRALINE HCL 100 MG PO TABS: 150.0000 mg | ORAL_TABLET | Freq: Every day | ORAL | 1 refills | Status: DC

## 2021-08-10 NOTE — Progress Notes (Signed)
Kenneth Orozco [id number] 23-Jan-1961 60 y.o.  Subjective:   Patient ID:  Kenneth Orozco is a 59 y.o. (DOB Nov 21, 1960) male.  Chief Complaint:  Chief Complaint  Patient presents with   Follow-up    Depression    HPI Kenneth Orozco presents to the office today for follow-up of depression. "I think I am ok." He reports that he has been having memories about his deceased father. He reports that he has some "guilt" about not being more insistent that his father move to [place]. He reports that he has some sadness in response to memories for about 10-15 minutes. Denies irritability. Denies anxiety or excessive worry. "I do need to sleep more." Averaging 6-7 hours a night. Energy is ok. Motivation has been good. Concentration has been ok. Denies anhedonia. Denies SI.   He has returned to work. He reports he initially had a hybrid schedule, then full-time, and then returned to a hybrid schedule.   Playing soccer.   [place] Office Visit from 12/28/2020 in [place] at [facility] Total Score 0  PHQ-9 Total Score 0        Review of Systems:  Review of Systems  Musculoskeletal:  Positive for back pain. Negative for gait problem.  Neurological:  Positive for light-headedness.       Occ light-headedness when going up stairs quickly.   Psychiatric/Behavioral:         Please refer to HPI   Medications: I have reviewed the patient's current medications.  Current Outpatient Medications  Medication Sig Dispense Refill   Cholecalciferol (VITAMIN D3) 2000 UNITS capsule Take 1 capsule (2,000 Units total) by mouth daily. 100 capsule 3   Cyanocobalamin (VITAMIN B 12 PO) Take by mouth.     GARLIC PO Take by mouth.     omeprazole (PRILOSEC) 40 MG capsule Take 40 mg by mouth every morning.     simvastatin (ZOCOR) 40 MG tablet Take 1 tablet (40 mg total) by mouth at bedtime. Keep scheduled appt for future refills 90 tablet 3   sertraline (ZOLOFT) 100 MG tablet Take 1.5  tablets (150 mg total) by mouth daily. 135 tablet 1   No current facility-administered medications for this visit.    Medication Side Effects: None  Allergies: No Known Allergies  Past Medical History:  Diagnosis Date   Depression    Hyperlipidemia    Hypertension    Rectal bleeding     Past Medical History, Surgical history, Social history, and Family history were reviewed and updated as appropriate.   Please see review of systems for further details on the patient's review from today.   Objective:   Physical Exam:  There were no vitals taken for this visit.  Physical Exam Constitutional:      General: He is not in acute distress. Musculoskeletal:        General: No deformity.  Neurological:     Mental Status: He is alert and oriented to person, place, and time.     Coordination: Coordination normal.  Psychiatric:        Attention and Perception: Attention and perception normal. He does not perceive auditory or visual hallucinations.        Mood and Affect: Mood normal. Mood is not anxious or depressed. Affect is not labile, blunt, angry or inappropriate.        Speech: Speech normal.        Behavior: Behavior normal.        Thought  Content: Thought content normal. Thought content is not paranoid or delusional. Thought content does not include homicidal or suicidal ideation. Thought content does not include homicidal or suicidal plan.        Cognition and Memory: Cognition and memory normal.        Judgment: Judgment normal.     Comments: Insight intact    Lab Review:     Component Value Date/Time   NA 140 12/28/2020 0831   K 3.9 12/28/2020 0831   CL 105 12/28/2020 0831   CO2 30 12/28/2020 0831   GLUCOSE 87 12/28/2020 0831   BUN 17 12/28/2020 0831   CREATININE 1.11 12/28/2020 0831   CALCIUM 9.5 12/28/2020 0831   PROT 6.6 12/28/2020 0831   ALBUMIN 4.2 12/28/2020 0831   AST 24 12/28/2020 0831   ALT 19 12/28/2020 0831   ALKPHOS 71 12/28/2020 0831   BILITOT  0.6 12/28/2020 0831   GFRNONAA 71.85 08/25/2010 0748   GFRAA 93 08/04/2008 1635       Component Value Date/Time   WBC 6.6 12/28/2020 0831   RBC 5.13 12/28/2020 0831   HGB 15.2 12/28/2020 0831   HCT 44.2 12/28/2020 0831   PLT 179.0 12/28/2020 0831   MCV 86.3 12/28/2020 0831   MCHC 34.3 12/28/2020 0831   RDW 13.5 12/28/2020 0831   LYMPHSABS 1.6 12/28/2020 0831   MONOABS 0.7 12/28/2020 0831   EOSABS 0.2 12/28/2020 0831   BASOSABS 0.0 12/28/2020 0831    No results found for: POCLITH, LITHIUM   No results found for: PHENYTOIN, PHENOBARB, VALPROATE, CBMZ   .res Assessment: Plan:   Pt seen for 30 minutes and time spent discussing recent mood and anxiety s/s. Kenneth Orozco reports that his mood has been stable overall aside from sadness in response to memories of his deceased father.  Will continue Sertraline 150 mg po qd for mood s/s.  Pt to follow-up in 6 months or sooner if clinically indicated.  Patient advised to contact office with any questions, adverse effects, or acute worsening in signs and symptoms.   Kenneth Orozco was seen today for follow-up.  Diagnoses and all orders for this visit:  Recurrent major depressive disorder, in partial remission (Kenneth Orozco) -     sertraline (ZOLOFT) 100 MG tablet; Take 1.5 tablets (150 mg total) by mouth daily.    Please see After Visit Summary for patient specific instructions.  Future Appointments  Date Time Provider [place]  02/08/2022  4:00 PM Thayer Headings, PMHNP [facility] None    No orders of the defined types were placed in this encounter.   -------------------------------

## 2021-08-11 DIAGNOSIS — R972 Elevated prostate specific antigen [PSA]: Secondary | ICD-10-CM | POA: Diagnosis not present

## 2021-08-11 DIAGNOSIS — N400 Enlarged prostate without lower urinary tract symptoms: Secondary | ICD-10-CM | POA: Diagnosis not present

## 2021-10-11 DIAGNOSIS — D2371 Other benign neoplasm of skin of right lower limb, including hip: Secondary | ICD-10-CM | POA: Diagnosis not present

## 2021-10-11 DIAGNOSIS — L720 Epidermal cyst: Secondary | ICD-10-CM | POA: Diagnosis not present

## 2021-10-11 DIAGNOSIS — L723 Sebaceous cyst: Secondary | ICD-10-CM | POA: Diagnosis not present

## 2021-10-11 DIAGNOSIS — L821 Other seborrheic keratosis: Secondary | ICD-10-CM | POA: Diagnosis not present

## 2021-12-25 ENCOUNTER — Other Ambulatory Visit: Payer: Self-pay | Admitting: Internal Medicine

## 2022-01-05 ENCOUNTER — Ambulatory Visit (INDEPENDENT_AMBULATORY_CARE_PROVIDER_SITE_OTHER): Payer: BC Managed Care – PPO | Admitting: Internal Medicine

## 2022-01-05 ENCOUNTER — Other Ambulatory Visit: Payer: Self-pay

## 2022-01-05 ENCOUNTER — Encounter: Payer: Self-pay | Admitting: Internal Medicine

## 2022-01-05 VITALS — BP 120/80 | HR 56 | Temp 98.2°F | Ht 64.0 in | Wt 191.0 lb

## 2022-01-05 DIAGNOSIS — E785 Hyperlipidemia, unspecified: Secondary | ICD-10-CM | POA: Diagnosis not present

## 2022-01-05 DIAGNOSIS — Z7184 Encounter for health counseling related to travel: Secondary | ICD-10-CM

## 2022-01-05 DIAGNOSIS — Z23 Encounter for immunization: Secondary | ICD-10-CM | POA: Diagnosis not present

## 2022-01-05 DIAGNOSIS — R251 Tremor, unspecified: Secondary | ICD-10-CM | POA: Diagnosis not present

## 2022-01-05 DIAGNOSIS — I10 Essential (primary) hypertension: Secondary | ICD-10-CM

## 2022-01-05 DIAGNOSIS — Z125 Encounter for screening for malignant neoplasm of prostate: Secondary | ICD-10-CM

## 2022-01-05 DIAGNOSIS — Z0000 Encounter for general adult medical examination without abnormal findings: Secondary | ICD-10-CM

## 2022-01-05 LAB — CBC WITH DIFFERENTIAL/PLATELET
Basophils Absolute: 0.1 10*3/uL (ref 0.0–0.1)
Basophils Relative: 1 % (ref 0.0–3.0)
Eosinophils Absolute: 0.3 10*3/uL (ref 0.0–0.7)
Eosinophils Relative: 3.6 % (ref 0.0–5.0)
HCT: 44 % (ref 39.0–52.0)
Hemoglobin: 14.8 g/dL (ref 13.0–17.0)
Lymphocytes Relative: 23.7 % (ref 12.0–46.0)
Lymphs Abs: 1.7 10*3/uL (ref 0.7–4.0)
MCHC: 33.6 g/dL (ref 30.0–36.0)
MCV: 86 fl (ref 78.0–100.0)
Monocytes Absolute: 0.7 10*3/uL (ref 0.1–1.0)
Monocytes Relative: 10.3 % (ref 3.0–12.0)
Neutro Abs: 4.4 10*3/uL (ref 1.4–7.7)
Neutrophils Relative %: 61.4 % (ref 43.0–77.0)
Platelets: 184 10*3/uL (ref 150.0–400.0)
RBC: 5.12 Mil/uL (ref 4.22–5.81)
RDW: 14.2 % (ref 11.5–15.5)
WBC: 7.1 10*3/uL (ref 4.0–10.5)

## 2022-01-05 LAB — LIPID PANEL
Cholesterol: 194 mg/dL (ref 0–200)
HDL: 57 mg/dL (ref >39.00)
LDL Cholesterol: 112 mg/dL — ABNORMAL HIGH (ref 0–99)
NonHDL: 137.07
Total CHOL/HDL Ratio: 3
Triglycerides: 126 mg/dL (ref 0.0–149.0)
VLDL: 25.2 mg/dL (ref 0.0–40.0)

## 2022-01-05 LAB — URINALYSIS
Bilirubin Urine: NEGATIVE
Hgb urine dipstick: NEGATIVE
Ketones, ur: NEGATIVE
Leukocytes,Ua: NEGATIVE
Nitrite: NEGATIVE
Specific Gravity, Urine: 1.025 (ref 1.000–1.030)
Total Protein, Urine: NEGATIVE
Urine Glucose: NEGATIVE
Urobilinogen, UA: 0.2 (ref 0.0–1.0)
pH: 6 (ref 5.0–8.0)

## 2022-01-05 LAB — COMPREHENSIVE METABOLIC PANEL
ALT: 19 U/L (ref 0–53)
AST: 22 U/L (ref 0–37)
Albumin: 4.5 g/dL (ref 3.5–5.2)
Alkaline Phosphatase: 65 U/L (ref 39–117)
BUN: 20 mg/dL (ref 6–23)
CO2: 34 mEq/L — ABNORMAL HIGH (ref 19–32)
Calcium: 9.5 mg/dL (ref 8.4–10.5)
Chloride: 104 mEq/L (ref 96–112)
Creatinine, Ser: 1.06 mg/dL (ref 0.40–1.50)
GFR: 76.38 mL/min (ref >60.00)
Glucose, Bld: 104 mg/dL — ABNORMAL HIGH (ref 70–99)
Potassium: 4.3 mEq/L (ref 3.5–5.1)
Sodium: 140 mEq/L (ref 135–145)
Total Bilirubin: 0.6 mg/dL (ref 0.2–1.2)
Total Protein: 7.3 g/dL (ref 6.0–8.3)

## 2022-01-05 LAB — TSH: TSH: 1.18 u[IU]/mL (ref 0.35–5.50)

## 2022-01-05 LAB — PSA: PSA: 6.55 ng/mL — ABNORMAL HIGH (ref 0.10–4.00)

## 2022-01-05 MED ORDER — SCOPOLAMINE 1 MG/3DAYS TD PT72: 1.0000 | MEDICATED_PATCH | TRANSDERMAL | 1 refills | Status: DC

## 2022-01-05 NOTE — Assessment & Plan Note (Signed)

## 2022-01-05 NOTE — Progress Notes (Signed)
Subjective:  [name], male    DOB: 1961-02-25  Age: 61 y.o. MRN: [id number]  CC: No chief complaint on file.   HPI Kenneth Orozco presents for a well exam C/o tremor in L hand  Outpatient Medications Prior to Visit  Medication Sig Dispense Refill   Cholecalciferol (VITAMIN D3) 2000 UNITS capsule Take 1 capsule (2,000 Units total) by mouth daily. 100 capsule 3   Cyanocobalamin (VITAMIN B 12 PO) Take by mouth.     GARLIC PO Take by mouth.     omeprazole (PRILOSEC) 40 MG capsule Take 40 mg by mouth every morning.     sertraline (ZOLOFT) 100 MG tablet Take 1.5 tablets (150 mg total) by mouth daily. 135 tablet 1   simvastatin (ZOCOR) 40 MG tablet TAKE 1 TABLET AT BEDTIME KEEP SCHEDULED APPOINTMENT FOR FURTHER REFILLS 90 tablet 3   No facility-administered medications prior to visit.    ROS: Review of Systems  Constitutional:  Negative for appetite change, fatigue and unexpected weight change.  HENT:  Negative for congestion, nosebleeds, sneezing, sore throat and trouble swallowing.   Eyes:  Negative for itching and visual disturbance.  Respiratory:  Negative for cough.   Cardiovascular:  Negative for chest pain, palpitations and leg swelling.  Gastrointestinal:  Negative for abdominal distention, blood in stool, diarrhea and nausea.  Genitourinary:  Negative for frequency and hematuria.  Musculoskeletal:  Positive for gait problem. Negative for back pain, joint swelling and neck pain.  Skin:  Negative for rash.  Neurological:  Negative for dizziness, tremors, speech difficulty and weakness.  Psychiatric/Behavioral:  Negative for agitation, dysphoric mood, sleep disturbance and suicidal ideas. The patient is not nervous/anxious.    Objective:  BP 120/80 (BP Location: Left Arm, Patient Position: Sitting, Cuff Size: Large)    Pulse (!) 56    Temp 98.2 F (36.8 C) (Oral)    Ht 5\' 4"  (1.626 m)    Wt 191 lb (86.6 kg)    SpO2 95%    BMI 32.79 kg/m   BP Readings from Last 3  Encounters:  01/05/22 120/80  12/28/20 112/80  08/04/19 126/78    Wt Readings from Last 3 Encounters:  01/05/22 191 lb (86.6 kg)  12/28/20 188 lb 12.8 oz (85.6 kg)  08/04/19 187 lb (84.8 kg)    Physical Exam Constitutional:      General: He is not in acute distress.    Appearance: He is well-developed.     Comments: NAD  Eyes:     Conjunctiva/sclera: Conjunctivae normal.     Pupils: Pupils are equal, round, and reactive to light.  Neck:     Thyroid: No thyromegaly.     Vascular: No JVD.  Cardiovascular:     Rate and Rhythm: Normal rate and regular rhythm.     Heart sounds: Normal heart sounds. No murmur heard.   No friction rub. No gallop.  Pulmonary:     Effort: Pulmonary effort is normal. No respiratory distress.     Breath sounds: Normal breath sounds. No wheezing or rales.  Chest:     Chest wall: No tenderness.  Abdominal:     General: Bowel sounds are normal. There is no distension.     Palpations: Abdomen is soft. There is no mass.     Tenderness: There is no abdominal tenderness. There is no guarding or rebound.  Musculoskeletal:        General: No tenderness. Normal range of motion.     Cervical back: Normal  range of motion.  Lymphadenopathy:     Cervical: No cervical adenopathy.  Skin:    General: Skin is warm and dry.     Findings: No rash.  Neurological:     Mental Status: He is alert and oriented to person, place, and time.     Cranial Nerves: No cranial nerve deficit.     Motor: No abnormal muscle tone.     Coordination: Coordination normal.     Gait: Gait normal.     Deep Tendon Reflexes: Reflexes are normal and symmetric.  Psychiatric:        Behavior: Behavior normal.        Thought Content: Thought content normal.        Judgment: Judgment normal.    Lab Results  Component Value Date   WBC 7.1 01/05/2022   HGB 14.8 01/05/2022   HCT 44.0 01/05/2022   PLT 184.0 01/05/2022   GLUCOSE 104 (H) 01/05/2022   CHOL 194 01/05/2022   TRIG 126.0  01/05/2022   HDL 57.00 01/05/2022   LDLDIRECT 145.6 08/01/2012   LDLCALC 112 (H) 01/05/2022   ALT 19 01/05/2022   AST 22 01/05/2022   NA 140 01/05/2022   K 4.3 01/05/2022   CL 104 01/05/2022   CREATININE 1.06 01/05/2022   BUN 20 01/05/2022   CO2 34 (H) 01/05/2022   TSH 1.18 01/05/2022   PSA 6.55 (H) 01/05/2022    CT CARDIAC SCORING (SELF PAY ONLY)  Addendum Date: 02/23/2021   ADDENDUM REPORT: 02/23/2021 08:14 EXAM: OVER-READ INTERPRETATION  CT CHEST The following report is an over-read performed by radiologist Dr. Alvino Blood [facility] on 02/23/2021. This over-read does not include interpretation of cardiac or coronary anatomy or pathology. The calcium coronary interpretation by the cardiologist is attached. COMPARISON:  None. FINDINGS: Limited view of the lung parenchyma demonstrates no suspicious nodularity. Airways are normal. Limited view of the mediastinum demonstrates no adenopathy. Esophagus normal. Limited view of the upper abdomen unremarkable. Limited view of the skeleton and chest wall is unremarkable. IMPRESSION: No significant extracardiac findings Electronically Signed   By: Suzy Bouchard M.D.   On: 02/23/2021 08:14   Result Date: 02/23/2021 CLINICAL DATA:  Cardiovascular Disease Risk stratification EXAM: Coronary Calcium Score TECHNIQUE: A gated, non-contrast computed tomography scan of the heart was performed using 50mm slice thickness. Axial images were analyzed on a dedicated workstation. Calcium scoring of the coronary arteries was performed using the Agatston method. FINDINGS: Coronary arteries: Normal origins. Coronary Calcium Score: Left main: 0 Left anterior descending artery: 15 Left circumflex artery: 1 Right coronary artery: 0 Total: 16 Percentile: 46 Pericardium: Normal. Ascending Aorta: Normal caliber. Non-cardiac: See separate report from [facility]. IMPRESSION: Coronary calcium score of 16. This was 46th percentile for age-, race-, and  sex-matched controls. RECOMMENDATIONS: Coronary artery calcium (CAC) score is a strong predictor of incident coronary heart disease (CHD) and provides predictive information beyond traditional risk factors. CAC scoring is reasonable to use in the decision to withhold, postpone, or initiate statin therapy in intermediate-risk or selected borderline-risk asymptomatic adults (age 30-75 years and LDL-C >=70 to <190 mg/dL) who do not have diabetes or established atherosclerotic cardiovascular disease (ASCVD).* In intermediate-risk (10-year ASCVD risk >=7.5% to <20%) adults or selected borderline-risk (10-year ASCVD risk >=5% to <7.5%) adults in whom a CAC score is measured for the purpose of making a treatment decision the following recommendations have been made: If CAC=0, it is reasonable to withhold statin therapy and reassess in 5 to  10 years, as long as higher risk conditions are absent (diabetes mellitus, family history of premature CHD in first degree relatives (males <55 years; females <65 years), cigarette smoking, or LDL >=190 mg/dL). If CAC is 1 to 99, it is reasonable to initiate statin therapy for patients >=69 years of age. If CAC is >=100 or >=75th percentile, it is reasonable to initiate statin therapy at any age. Cardiology referral should be considered for patients with CAC scores >=400 or >=75th percentile. *2018 AHA/ACC/AACVPR/AAPA/ABC/ACPM/ADA/AGS/APhA/ASPC/NLA/PCNA Guideline on the Management of Blood Cholesterol: A Report of the American College of Cardiology/American Heart Association Task Force on Clinical Practice Guidelines. J Am Coll Cardiol. 2019;73(24):3168-3209. Oswaldo Milian, MD Electronically Signed: By: Oswaldo Milian MD On: 02/22/2021 20:50    Assessment & Plan:   Problem List Items Addressed This Visit     Dyslipidemia    Currently on simvastatin      Essential hypertension    BP Readings from Last 3 Encounters:  01/05/22 120/80  12/28/20 112/80  08/04/19  126/78        Travel advice encounter    Scopolamine patch for cruise      Tremor     Very mild.  Seems to be familial.  Diagnostic/treatment options discussed.  Will watch for now      Well adult exam - Primary     We discussed age appropriate health related issues, including available/recomended screening tests and vaccinations. Labs were ordered to be later reviewed . All questions were answered. We discussed one or more of the following - seat belt use, use of sunscreen/sun exposure exercise, fall risk reduction, second hand smoke exposure, firearm use and storage, seat belt use, a need for adhering to healthy diet and exercise. Labs were ordered.  All questions were answered.        Relevant Orders   TSH (Completed)   Urinalysis (Completed)   CBC with Differential/Platelet (Completed)   Lipid panel (Completed)   PSA (Completed)   Comprehensive metabolic panel (Completed)   Other Visit Diagnoses     Need for Tdap vaccination       Relevant Orders   Tdap vaccine greater than or equal to 7yo IM (Completed)         Meds ordered this encounter  Medications   scopolamine (TRANSDERM-SCOP) 1 MG/3DAYS    Sig: Place 1 patch (1.5 mg total) onto the skin every 3 (three) days.    Dispense:  4 patch    Refill:  1      Follow-up: Return in about 3 months (around 04/04/2022) for a follow-up visit.  Walker Kehr, MD

## 2022-01-05 NOTE — Assessment & Plan Note (Signed)
Currently on simvastatin

## 2022-01-05 NOTE — Patient Instructions (Signed)
For a mild COVID-19 case - take zinc 50 mg a day for 1 week, vitamin C 1000 mg daily for 1 week, vitamin D2 50,000 units weekly for 2 months (unless  taking vitamin D daily already), an antioxidant Quercetin 500 mg twice a day for 1 week (if you can get it quick enough). Take Allegra or Benadryl.  Maintain good oral hydration and take Tylenol for high fever.  Call if problems. Isolate for 5 days, then wear a mask for 5 days per CDC.  

## 2022-01-05 NOTE — Assessment & Plan Note (Signed)
On Sertaline

## 2022-01-29 DIAGNOSIS — R251 Tremor, unspecified: Secondary | ICD-10-CM | POA: Insufficient documentation

## 2022-01-29 DIAGNOSIS — Z7184 Encounter for health counseling related to travel: Secondary | ICD-10-CM | POA: Insufficient documentation

## 2022-01-29 NOTE — Assessment & Plan Note (Signed)
BP Readings from Last 3 Encounters:  ?01/05/22 120/80  ?12/28/20 112/80  ?08/04/19 126/78  ? ? ?

## 2022-01-29 NOTE — Assessment & Plan Note (Signed)
Very mild.  Seems to be familial.  Diagnostic/treatment options discussed.  Will watch for now ?

## 2022-01-29 NOTE — Assessment & Plan Note (Signed)
Scopolamine patch for cruise ?

## 2022-02-06 ENCOUNTER — Other Ambulatory Visit: Payer: Self-pay | Admitting: Psychiatry

## 2022-02-06 DIAGNOSIS — F3341 Major depressive disorder, recurrent, in partial remission: Secondary | ICD-10-CM

## 2022-02-06 NOTE — Telephone Encounter (Signed)
Patient has an appt with JC this week.  ?

## 2022-02-08 ENCOUNTER — Ambulatory Visit (INDEPENDENT_AMBULATORY_CARE_PROVIDER_SITE_OTHER): Payer: BC Managed Care – PPO | Admitting: Psychiatry

## 2022-02-08 ENCOUNTER — Other Ambulatory Visit: Payer: Self-pay

## 2022-02-08 ENCOUNTER — Encounter: Payer: Self-pay | Admitting: Psychiatry

## 2022-02-08 DIAGNOSIS — F3341 Major depressive disorder, recurrent, in partial remission: Secondary | ICD-10-CM | POA: Diagnosis not present

## 2022-02-08 MED ORDER — SERTRALINE HCL 100 MG PO TABS: 150.0000 mg | ORAL_TABLET | Freq: Every day | ORAL | 1 refills | Status: DC

## 2022-02-08 NOTE — Progress Notes (Signed)
[name] ?[id number] ?[birth year] ?61 y.o. ? ?Subjective:  ? ?Patient ID:  Kenneth Orozco is a 61 y.o. (DOB 10/10/1961) male. ? ?Chief Complaint:  ?Chief Complaint  ?Patient presents with  ? Follow-up  ?  Depression  ? ? ?Depression ?      ?Kenneth Orozco presents to the office today for follow-up of depression. "Things are good. No complaints." He reports continuing to miss his parents. Denies anxiety. Denies depressed mood. Energy and motivation have been ok. Concentration is ok. He is occasionally distracted by thinking he needs to go to another task. He occasionally has to remind himself to finish a task before starting a new one. Some multi-tasking. Sleeping well. Appetite has been good. Denies SI.  ? ?He just returned from vacation. He anticipates returning to increased amount of work. He is playing soccer once a week.  ? ?He reports that his son was recently accepted to [employer].  ? ?PHQ2-9   ? ?[place] Office Visit from 01/05/2022 in [place] at [facility] Visit from 12/28/2020 in [place] at [place]  ?PHQ-2 Total Score 0 0  ?PHQ-9 Total Score 0 0  ? ?  ?  ? ?Review of Systems:  ?Review of Systems  ?Musculoskeletal:  Negative for gait problem.  ?Neurological:   ?     He reports an occ "micro tremor" in left hand while typing  ?Psychiatric/Behavioral:  Positive for depression.   ?     Please refer to HPI  ? ?Medications: I have reviewed the patient's current medications. ? ?Current Outpatient Medications  ?Medication Sig Dispense Refill  ? Cholecalciferol (VITAMIN D3) 2000 UNITS capsule Take 1 capsule (2,000 Units total) by mouth daily. 100 capsule 3  ? Cyanocobalamin (VITAMIN B 12 PO) Take by mouth.    ? GARLIC PO Take by mouth.    ? simvastatin (ZOCOR) 40 MG tablet TAKE 1 TABLET AT BEDTIME KEEP SCHEDULED APPOINTMENT FOR FURTHER REFILLS 90 tablet 3  ? scopolamine (TRANSDERM-SCOP) 1 MG/3DAYS Place 1 patch (1.5 mg total) onto the skin every 3 (three) days. (Patient not taking:  Reported on 02/08/2022) 4 patch 1  ? sertraline (ZOLOFT) 100 MG tablet Take 1.5 tablets (150 mg total) by mouth daily. 135 tablet 1  ? ?No current facility-administered medications for this visit.  ? ? ?Medication Side Effects: None ? ?Allergies: No Known Allergies ? ?Past Medical History:  ?Diagnosis Date  ? Depression   ? Hyperlipidemia   ? Hypertension   ? Rectal bleeding   ? ? ?Past Medical History, Surgical history, Social history, and Family history were reviewed and updated as appropriate.  ? ?Please see review of systems for further details on the patient's review from today.  ? ?Objective:  ? ?Physical Exam:  ?There were no vitals taken for this visit. ? ?Physical Exam ?Constitutional:   ?   General: He is not in acute distress. ?Musculoskeletal:     ?   General: No deformity.  ?Neurological:  ?   Mental Status: He is alert and oriented to person, place, and time.  ?   Coordination: Coordination normal.  ?Psychiatric:     ?   Attention and Perception: Attention and perception normal. He does not perceive auditory or visual hallucinations.     ?   Mood and Affect: Mood normal. Mood is not anxious or depressed. Affect is not labile, blunt, angry or inappropriate.     ?   Speech: Speech normal.     ?  Behavior: Behavior normal.     ?   Thought Content: Thought content normal. Thought content is not paranoid or delusional. Thought content does not include homicidal or suicidal ideation. Thought content does not include homicidal or suicidal plan.     ?   Cognition and Memory: Cognition and memory normal.     ?   Judgment: Judgment normal.  ?   Comments: Insight intact  ? ? ?Lab Review:  ?   ?Component Value Date/Time  ? NA 140 01/05/2022 1022  ? K 4.3 01/05/2022 1022  ? CL 104 01/05/2022 1022  ? CO2 34 (H) 01/05/2022 1022  ? GLUCOSE 104 (H) 01/05/2022 1022  ? BUN 20 01/05/2022 1022  ? CREATININE 1.06 01/05/2022 1022  ? CALCIUM 9.5 01/05/2022 1022  ? PROT 7.3 01/05/2022 1022  ? ALBUMIN 4.5 01/05/2022 1022  ? AST  22 01/05/2022 1022  ? ALT 19 01/05/2022 1022  ? ALKPHOS 65 01/05/2022 1022  ? BILITOT 0.6 01/05/2022 1022  ? GFRNONAA 71.85 08/25/2010 0748  ? GFRAA 93 08/04/2008 1635  ? ? ?   ?Component Value Date/Time  ? WBC 7.1 01/05/2022 1022  ? RBC 5.12 01/05/2022 1022  ? HGB 14.8 01/05/2022 1022  ? HCT 44.0 01/05/2022 1022  ? PLT 184.0 01/05/2022 1022  ? MCV 86.0 01/05/2022 1022  ? MCHC 33.6 01/05/2022 1022  ? RDW 14.2 01/05/2022 1022  ? LYMPHSABS 1.7 01/05/2022 1022  ? MONOABS 0.7 01/05/2022 1022  ? EOSABS 0.3 01/05/2022 1022  ? BASOSABS 0.1 01/05/2022 1022  ? ? ?No results found for: POCLITH, LITHIUM  ? ?No results found for: PHENYTOIN, PHENOBARB, VALPROATE, CBMZ  ? ?.res ?Assessment: Plan:   ?Will continue current plan of care since target signs and symptoms are well controlled without any tolerability issues. ?Continue Sertraline 150 mg po qd for depression.  ?Pt to follow-up in 6 months or sooner if clinically indicated.  ?Patient advised to contact office with any questions, adverse effects, or acute worsening in signs and symptoms. ? ?Kenneth Orozco was seen today for follow-up. ? ?Diagnoses and all orders for this visit: ? ?Recurrent major depressive disorder, in partial remission (HCC) ?-     sertraline (ZOLOFT) 100 MG tablet; Take 1.5 tablets (150 mg total) by mouth daily. ? ?  ? ?Please see After Visit Summary for patient specific instructions. ? ?Future Appointments  ?Date Time Provider [place]  ?04/05/2022  8:10 AM Plotnikov, Evie Lacks, MD [facility] None  ? ? ?No orders of the defined types were placed in this encounter. ? ? ?------------------------------- ?

## 2022-02-09 DIAGNOSIS — N400 Enlarged prostate without lower urinary tract symptoms: Secondary | ICD-10-CM | POA: Diagnosis not present

## 2022-02-09 DIAGNOSIS — R972 Elevated prostate specific antigen [PSA]: Secondary | ICD-10-CM | POA: Diagnosis not present

## 2022-02-09 DIAGNOSIS — D176 Benign lipomatous neoplasm of spermatic cord: Secondary | ICD-10-CM | POA: Diagnosis not present

## 2022-03-21 ENCOUNTER — Other Ambulatory Visit: Payer: Self-pay | Admitting: Urology

## 2022-03-21 DIAGNOSIS — R972 Elevated prostate specific antigen [PSA]: Secondary | ICD-10-CM

## 2022-04-05 ENCOUNTER — Ambulatory Visit: Payer: BC Managed Care – PPO | Admitting: Internal Medicine

## 2022-04-10 ENCOUNTER — Ambulatory Visit: Payer: BC Managed Care – PPO | Admitting: Internal Medicine

## 2022-04-10 ENCOUNTER — Encounter: Payer: Self-pay | Admitting: Internal Medicine

## 2022-04-10 DIAGNOSIS — Z7184 Encounter for health counseling related to travel: Secondary | ICD-10-CM

## 2022-04-10 DIAGNOSIS — M545 Low back pain, unspecified: Secondary | ICD-10-CM | POA: Diagnosis not present

## 2022-04-10 DIAGNOSIS — I10 Essential (primary) hypertension: Secondary | ICD-10-CM

## 2022-04-10 DIAGNOSIS — R739 Hyperglycemia, unspecified: Secondary | ICD-10-CM | POA: Insufficient documentation

## 2022-04-10 DIAGNOSIS — E785 Hyperlipidemia, unspecified: Secondary | ICD-10-CM | POA: Diagnosis not present

## 2022-04-10 DIAGNOSIS — F32A Depression, unspecified: Secondary | ICD-10-CM

## 2022-04-10 DIAGNOSIS — R251 Tremor, unspecified: Secondary | ICD-10-CM

## 2022-04-10 NOTE — Assessment & Plan Note (Addendum)
Check A1c. Loose wt 

## 2022-04-10 NOTE — Assessment & Plan Note (Signed)
Monitor BP at home Call if elevated

## 2022-04-10 NOTE — Assessment & Plan Note (Addendum)
Chronic Cont on Sertaline Regular exercise, rest

## 2022-04-10 NOTE — Progress Notes (Signed)
Subjective:  [name], male    DOB: 04-26-1961  Age: 61 y.o. MRN: [id number]  CC: Follow-up   HPI Edrick Whitehorn presents for elevated glucose, elevated BP, dyslipidemia, tremor Safi is stressed this am   Outpatient Medications Prior to Visit  Medication Sig Dispense Refill   Cholecalciferol (VITAMIN D3) 2000 UNITS capsule Take 1 capsule (2,000 Units total) by mouth daily. 100 capsule 3   Cyanocobalamin (VITAMIN B 12 PO) Take by mouth.     GARLIC PO Take by mouth.     scopolamine (TRANSDERM-SCOP) 1 MG/3DAYS Place 1 patch (1.5 mg total) onto the skin every 3 (three) days. 4 patch 1   sertraline (ZOLOFT) 100 MG tablet Take 1.5 tablets (150 mg total) by mouth daily. 135 tablet 1   simvastatin (ZOCOR) 40 MG tablet TAKE 1 TABLET AT BEDTIME KEEP SCHEDULED APPOINTMENT FOR FURTHER REFILLS 90 tablet 3   No facility-administered medications prior to visit.    ROS: Review of Systems  Constitutional:  Negative for appetite change, fatigue and unexpected weight change.  HENT:  Negative for congestion, nosebleeds, sneezing, sore throat and trouble swallowing.   Eyes:  Negative for itching and visual disturbance.  Respiratory:  Negative for cough.   Cardiovascular:  Negative for chest pain, palpitations and leg swelling.  Gastrointestinal:  Negative for abdominal distention, blood in stool, diarrhea and nausea.  Genitourinary:  Negative for frequency and hematuria.  Musculoskeletal:  Negative for back pain, gait problem, joint swelling and neck pain.  Skin:  Negative for rash.  Neurological:  Negative for dizziness, tremors, speech difficulty and weakness.  Psychiatric/Behavioral:  Negative for agitation, dysphoric mood and sleep disturbance. The patient is not nervous/anxious.    Objective:  BP (!) 160/98 (BP Location: Left Arm, Patient Position: Sitting, Cuff Size: Normal)   Pulse 69   Temp 98.2 F (36.8 C) (Oral)   Ht '5\' 4"'$  (1.626 m)   Wt 187 lb 9.6 oz (85.1 kg)   SpO2  97%   BMI 32.20 kg/m   BP Readings from Last 3 Encounters:  04/10/22 (!) 160/98  01/05/22 120/80  12/28/20 112/80    Wt Readings from Last 3 Encounters:  04/10/22 187 lb 9.6 oz (85.1 kg)  01/05/22 191 lb (86.6 kg)  12/28/20 188 lb 12.8 oz (85.6 kg)    Physical Exam Constitutional:      General: He is not in acute distress.    Appearance: He is well-developed.     Comments: NAD  Eyes:     Conjunctiva/sclera: Conjunctivae normal.     Pupils: Pupils are equal, round, and reactive to light.  Neck:     Thyroid: No thyromegaly.     Vascular: No JVD.  Cardiovascular:     Rate and Rhythm: Normal rate and regular rhythm.     Heart sounds: Normal heart sounds. No murmur heard.   No friction rub. No gallop.  Pulmonary:     Effort: Pulmonary effort is normal. No respiratory distress.     Breath sounds: Normal breath sounds. No wheezing or rales.  Chest:     Chest wall: No tenderness.  Abdominal:     General: Bowel sounds are normal. There is no distension.     Palpations: Abdomen is soft. There is no mass.     Tenderness: There is no abdominal tenderness. There is no guarding or rebound.  Musculoskeletal:        General: No tenderness. Normal range of motion.     Cervical back:  Normal range of motion.  Lymphadenopathy:     Cervical: No cervical adenopathy.  Skin:    General: Skin is warm and dry.     Findings: No rash.  Neurological:     Mental Status: He is alert and oriented to person, place, and time.     Cranial Nerves: No cranial nerve deficit.     Motor: No abnormal muscle tone.     Coordination: Coordination normal.     Gait: Gait normal.     Deep Tendon Reflexes: Reflexes are normal and symmetric.  Psychiatric:        Behavior: Behavior normal.        Thought Content: Thought content normal.        Judgment: Judgment normal.   Re-checked BP: same No tremor   Lab Results  Component Value Date   WBC 7.1 01/05/2022   HGB 14.8 01/05/2022   HCT 44.0  01/05/2022   PLT 184.0 01/05/2022   GLUCOSE 104 (H) 01/05/2022   CHOL 194 01/05/2022   TRIG 126.0 01/05/2022   HDL 57.00 01/05/2022   LDLDIRECT 145.6 08/01/2012   LDLCALC 112 (H) 01/05/2022   ALT 19 01/05/2022   AST 22 01/05/2022   NA 140 01/05/2022   K 4.3 01/05/2022   CL 104 01/05/2022   CREATININE 1.06 01/05/2022   BUN 20 01/05/2022   CO2 34 (H) 01/05/2022   TSH 1.18 01/05/2022   PSA 6.55 (H) 01/05/2022    CT CARDIAC SCORING (SELF PAY ONLY)  Addendum Date: 02/23/2021   ADDENDUM REPORT: 02/23/2021 08:14 EXAM: OVER-READ INTERPRETATION  CT CHEST The following report is an over-read performed by radiologist Dr. Alvino Blood [facility] on 02/23/2021. This over-read does not include interpretation of cardiac or coronary anatomy or pathology. The calcium coronary interpretation by the cardiologist is attached. COMPARISON:  None. FINDINGS: Limited view of the lung parenchyma demonstrates no suspicious nodularity. Airways are normal. Limited view of the mediastinum demonstrates no adenopathy. Esophagus normal. Limited view of the upper abdomen unremarkable. Limited view of the skeleton and chest wall is unremarkable. IMPRESSION: No significant extracardiac findings Electronically Signed   By: Suzy Bouchard M.D.   On: 02/23/2021 08:14   Result Date: 02/23/2021 CLINICAL DATA:  Cardiovascular Disease Risk stratification EXAM: Coronary Calcium Score TECHNIQUE: A gated, non-contrast computed tomography scan of the heart was performed using 57m slice thickness. Axial images were analyzed on a dedicated workstation. Calcium scoring of the coronary arteries was performed using the Agatston method. FINDINGS: Coronary arteries: Normal origins. Coronary Calcium Score: Left main: 0 Left anterior descending artery: 15 Left circumflex artery: 1 Right coronary artery: 0 Total: 16 Percentile: 46 Pericardium: Normal. Ascending Aorta: Normal caliber. [facility]-cardiac: [facility]. IMPRESSION: Coronary calcium score of 16. This was 46th percentile for age-, race-, and sex-matched controls. RECOMMENDATIONS: Coronary artery calcium (CAC) score is a strong predictor of incident coronary heart disease (CHD) and provides predictive information beyond traditional risk factors. CAC scoring is reasonable to use in the decision to withhold, postpone, or initiate statin therapy in intermediate-risk or selected borderline-risk asymptomatic adults (age 61-75years and LDL-C >=70 to <190 mg/dL) who do not have diabetes or established atherosclerotic cardiovascular disease (ASCVD).* In intermediate-risk (10-year ASCVD risk >=7.5% to <20%) adults or selected borderline-risk (10-year ASCVD risk >=5% to <7.5%) adults in whom a CAC score is measured for the purpose of making a treatment decision the following recommendations have been made: If CAC=0, it is reasonable to withhold  statin therapy and reassess in 5 to 10 years, as long as higher risk conditions are absent (diabetes mellitus, family history of premature CHD in first degree relatives (males <55 years; females <65 years), cigarette smoking, or LDL >=190 mg/dL). If CAC is 1 to 99, it is reasonable to initiate statin therapy for patients >=3 years of age. If CAC is >=100 or >=75th percentile, it is reasonable to initiate statin therapy at any age. Cardiology referral should be considered for patients with CAC scores >=400 or >=75th percentile. *2018 AHA/ACC/AACVPR/AAPA/ABC/ACPM/ADA/AGS/APhA/ASPC/NLA/PCNA Guideline on the Management of Blood Cholesterol: A Report of the American College of Cardiology/American Heart Association Task Force on Clinical Practice Guidelines. J Am Coll Cardiol. 2019;73(24):3168-3209. Oswaldo Milian, MD Electronically Signed: By: Oswaldo Milian MD On: 02/22/2021 20:50    Assessment & Plan:   Problem List Items Addressed This Visit     Dyslipidemia    On Simvastatin       Relevant Orders    Comprehensive metabolic panel   Chronic depression    Chronic Cont on Sertaline Regular exercise, rest      LOW BACK PAIN   Essential hypertension    Monitor BP at home Call if elevated       Tremor    Cont to watch - very mild       Travel advice encounter   Hyperglycemia    Check A1c Loose wt       Relevant Orders   Comprehensive metabolic panel   Hemoglobin A1c      No orders of the defined types were placed in this encounter.     Follow-up: Return in about 6 months (around 10/11/2022) for a follow-up visit.  Walker Kehr, MD

## 2022-04-10 NOTE — Assessment & Plan Note (Deleted)
Dr Raeford Razor, Dr Lynann Bologna

## 2022-04-10 NOTE — Assessment & Plan Note (Signed)
On Simvastatin 

## 2022-04-10 NOTE — Assessment & Plan Note (Signed)
Cont to watch - very mild

## 2022-04-13 ENCOUNTER — Other Ambulatory Visit (INDEPENDENT_AMBULATORY_CARE_PROVIDER_SITE_OTHER): Payer: BC Managed Care – PPO

## 2022-04-13 DIAGNOSIS — E785 Hyperlipidemia, unspecified: Secondary | ICD-10-CM

## 2022-04-13 DIAGNOSIS — R739 Hyperglycemia, unspecified: Secondary | ICD-10-CM

## 2022-04-13 LAB — COMPREHENSIVE METABOLIC PANEL
ALT: 17 U/L (ref 0–53)
AST: 22 U/L (ref 0–37)
Albumin: 4.4 g/dL (ref 3.5–5.2)
Alkaline Phosphatase: 68 U/L (ref 39–117)
BUN: 16 mg/dL (ref 6–23)
CO2: 30 mEq/L (ref 19–32)
Calcium: 9.2 mg/dL (ref 8.4–10.5)
Chloride: 102 mEq/L (ref 96–112)
Creatinine, Ser: 1.14 mg/dL (ref 0.40–1.50)
GFR: 69.86 mL/min (ref >60.00)
Glucose, Bld: 91 mg/dL (ref 70–99)
Potassium: 3.9 mEq/L (ref 3.5–5.1)
Sodium: 139 mEq/L (ref 135–145)
Total Bilirubin: 0.5 mg/dL (ref 0.2–1.2)
Total Protein: 6.9 g/dL (ref 6.0–8.3)

## 2022-04-13 LAB — HEMOGLOBIN A1C: Hgb A1c MFr Bld: 5.8 % (ref 4.6–6.5)

## 2022-04-20 ENCOUNTER — Ambulatory Visit
Admission: RE | Admit: 2022-04-20 | Discharge: 2022-04-20 | Disposition: A | Discharge status: Home / Self Care | Payer: BC Managed Care – PPO | Source: Ambulatory Visit | Attending: Urology | Admitting: Urology

## 2022-04-20 DIAGNOSIS — R590 Localized enlarged lymph nodes: Secondary | ICD-10-CM | POA: Diagnosis not present

## 2022-04-20 DIAGNOSIS — R972 Elevated prostate specific antigen [PSA]: Secondary | ICD-10-CM | POA: Diagnosis not present

## 2022-04-20 IMAGING — MR MR PROSTATE WO/W CM
12 series · 48 of 48 positions shown · IV contrast (17ml Multihance)
Comparison: None Available.

CLINICAL DATA: Elevated PSA equal 6.[AGE] male.

EXAM:
MR PROSTATE WITHOUT AND WITH CONTRAST
TECHNIQUE: Multiplanar multisequence MRI images were obtained of the pelvis
centered about the prostate. Pre and post contrast images were
obtained.
CONTRAST:  17mL MULTIHANCE GADOBENATE DIMEGLUMINE 529 MG/ML IV SOLN

[Series 3: T2 · coronal · 3.0mm · 0.56mm/px · 1 of 26 slices shown (1 of 3)]
[im 1/26]
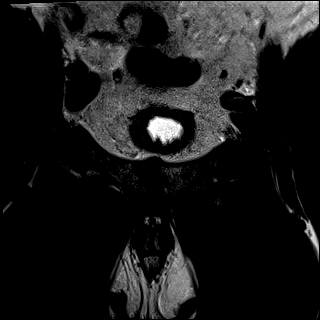

[Series 4: T1 · axial · 5.0mm · 1.25mm/px · 1 of 88 slices shown]
[im 1/88]
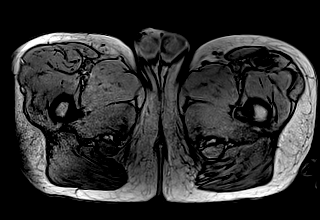

[Series 5: DWI · axial · 3.0mm · 1.75mm/px · 1 of 78 slices shown (1 of 3)]
[im 1/78]
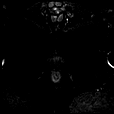

[Series 6: DWI · axial · 3.0mm · 1.75mm/px · 1 of 26 slices shown (2 of 3)]
[im 1/26]
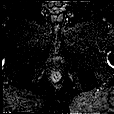

[Series 7: DWI · axial · 3.0mm · 1.75mm/px · 1 of 26 slices shown (3 of 3)]
[im 1/26]
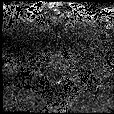

[Series 8: T2 · axial · 3.0mm · 0.56mm/px · 1 of 26 slices shown (2 of 3)]
[im 1/26]
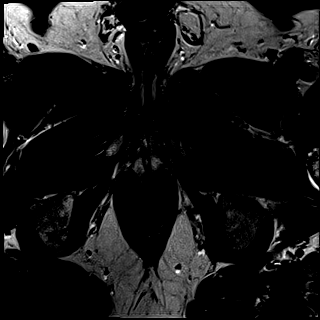

[Series 9: T2 · axial · 1.0mm · 1.04mm/px · z∈[-53,+18]mm · 2 of 72 slices shown (3 of 3)]
[im 1/72]
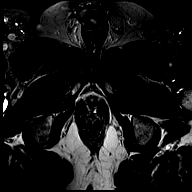
[im 72/72]
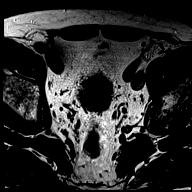

[Series 10: pre t1_twist_tra_dyn · axial · non-contrast · 3.5mm · 0.83mm/px · 1 of 20 slices shown]
[im 1/20]
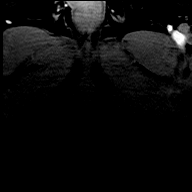

[Series 11: post t1_twist_tra_dyn-copy center · axial · non-contrast · 3.5mm · 0.83mm/px · z∈[-58,+22]mm · 18 of 720 slices shown]
[im 1/720]
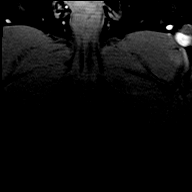
[im 43/720]
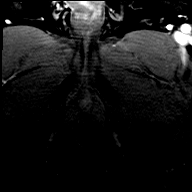
[im 85/720]
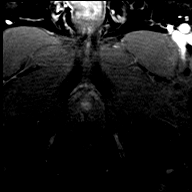
[im 127/720]
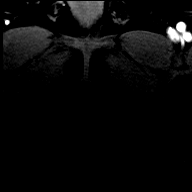
[im 170/720]
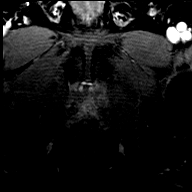
[im 212/720]
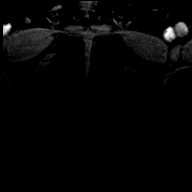
[im 254/720]
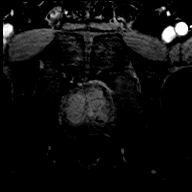
[im 297/720]
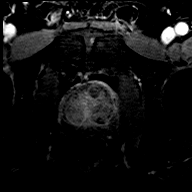
[im 339/720]
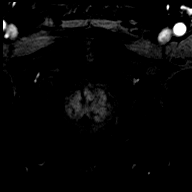
[im 381/720]
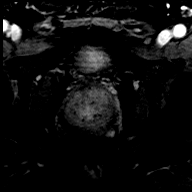
[im 423/720]
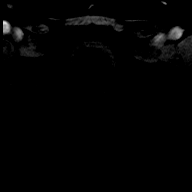
[im 466/720]
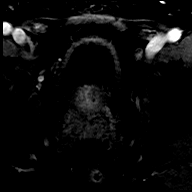
[im 508/720]
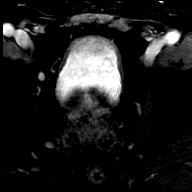
[im 550/720]
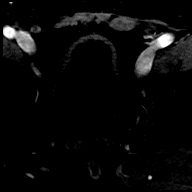
[im 593/720]
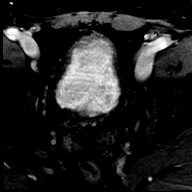
[im 635/720]
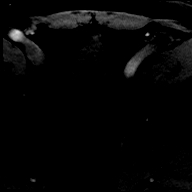
[im 677/720]
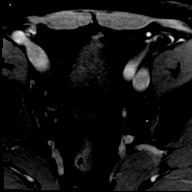
[im 720/720]
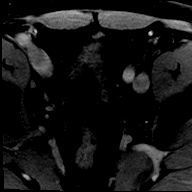

[Series 12: post t1_twist_tra_dyn-copy cent_sub · axial · 3.5mm · 0.83mm/px · z∈[-58,+22]mm · 17 of 696 slices shown]
[im 1/696]
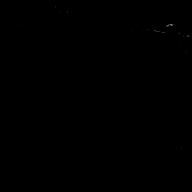
[im 44/696]
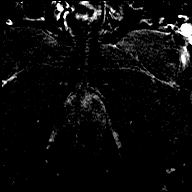
[im 87/696]
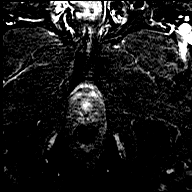
[im 131/696]
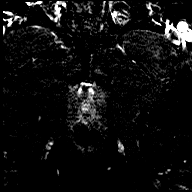
[im 174/696]
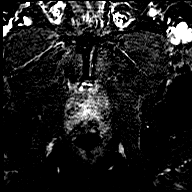
[im 218/696]
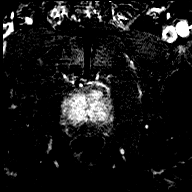
[im 261/696]
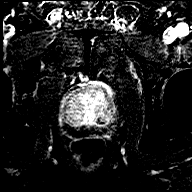
[im 305/696]
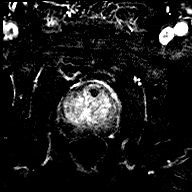
[im 348/696]
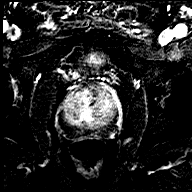
[im 391/696]
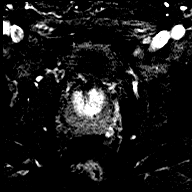
[im 435/696]
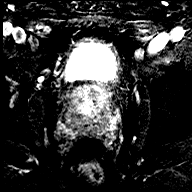
[im 478/696]
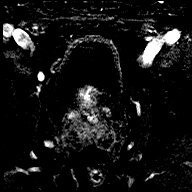
[im 522/696]
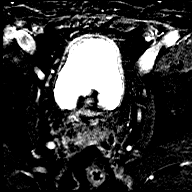
[im 565/696]
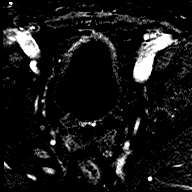
[im 609/696]
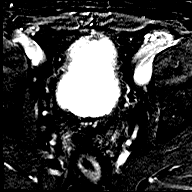
[im 652/696]
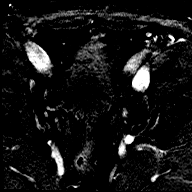
[im 696/696]
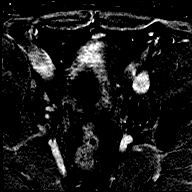

[Series 13: t1_vibe_dixon_tra_f · axial · 2.5mm · 0.91mm/px · z∈[-82,+113]mm · 2 of 79 slices shown]
[im 1/79]
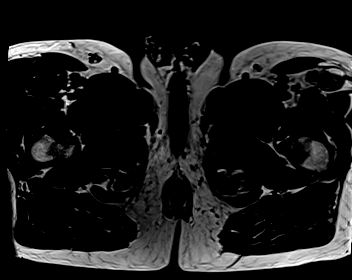
[im 79/79]
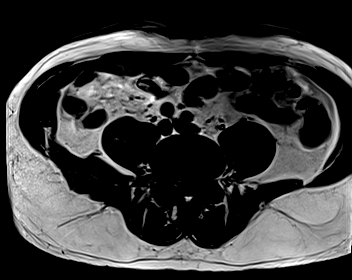

[Series 14: t1_vibe_dixon_tra_w · axial · 2.5mm · 0.91mm/px · z∈[-85,+113]mm · 2 of 80 slices shown]
[im 1/80]
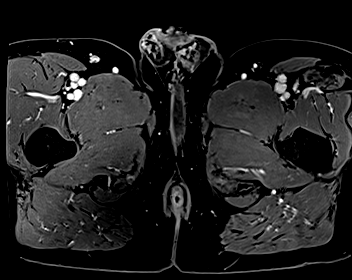
[im 80/80]
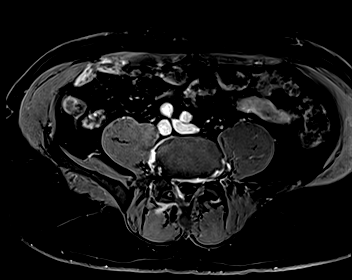

[48 of 48 positions shown; findings below may reference images not displayed]

FINDINGS: Prostate:

Peripheral zone: No foci of restricted diffusion within the
peripheral zone (series 6). Normal high signal intensity in the
peripheral zone on T2 weighted imaging (series 8). No abnormal
enhancement within the peripheral zone (series 11).

Transitional zone:The transitional zone is enlarged by capsulated
nodules without suspicious imaging characteristics on T2 weighted
imaging.

Volume: 4.9 x 4.3 x 4.4 cm (volume = 49 cm^3)

Transcapsular spread:  Absent

Seminal vesicle involvement: Absent

Neurovascular bundle involvement: Absent

Pelvic adenopathy: Absent

Bone metastasis: Absent

Other findings: None
IMPRESSION: 1. No high-grade carcinoma identified in the peripheral zone.
PI-RADS: 1.
2. Enlarged nodular transitional zone most consistent with benign
prostate hypertrophy. PI-RADS: 2

## 2022-04-20 MED ORDER — GADOBENATE DIMEGLUMINE 529 MG/ML IV SOLN
17.0000 mL | Freq: Once | INTRAVENOUS | Status: AC | PRN
  Administered 2022-04-20: 17 mL via INTRAVENOUS

## 2022-07-02 ENCOUNTER — Ambulatory Visit (INDEPENDENT_AMBULATORY_CARE_PROVIDER_SITE_OTHER): Payer: BC Managed Care – PPO | Admitting: Psychiatry

## 2022-07-02 ENCOUNTER — Encounter: Payer: Self-pay | Admitting: Psychiatry

## 2022-07-02 DIAGNOSIS — F3341 Major depressive disorder, recurrent, in partial remission: Secondary | ICD-10-CM

## 2022-07-02 NOTE — Progress Notes (Signed)
Kenneth Orozco [id number] 03-18-1961 61 y.o.  Subjective:   Patient ID:  Kenneth Orozco is a 61 y.o. (DOB [birth year]) male.  Chief Complaint:  Chief Complaint  Patient presents with  . Anxiety  . Depression    HPI Kenneth Orozco presents to the office today for follow-up of depression and mild anxiety. He reports that he had a "few rough weeks... 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 He has also had some other stressful work place dynamics. He reports that his mood was affected by work stress- "emotional drain." He reports that he experienced some frustration and irritation and this was not outwardly directed. "The depression was still there but not to the level where it could get worse." He reports he was able to function without difficulty . He went on vacation 1.5 months ago and he noticed some rumination about work throughout his vacation. He was having work related stress dreams. He reports that worry and rumination has decreased over the last week. He has been trying to exercise to help with his energy and would like to play soccer again. Motivation has been ok "because there's no room for low motivation." He reports that sleep is ok. Concentration was slightly decreased and may be improving. Appetite was decreased and has been improving. Denies SI.   He "started to feel a little better" after some stressors resolved at work.    [place] Office Visit from 04/10/2022 in [place] at [facility] Visit from 01/05/2022 in [place] at [facility] Visit from 12/28/2020 in [place] at [facility] Total Score 0 0 0  PHQ-9 Total Score 0 0 0        Review of Systems:  Review of Systems  Gastrointestinal: Negative.   Musculoskeletal:  Negative for gait problem.  Neurological:  Negative for headaches.  Psychiatric/Behavioral:         Please refer to HPI    Medications: I have reviewed the patient's  current medications.  Current Outpatient Medications  Medication Sig Dispense Refill  . Cholecalciferol (VITAMIN D3) 2000 UNITS capsule Take 1 capsule (2,000 Units total) by mouth daily. 100 capsule 3  . Cyanocobalamin (VITAMIN B 12 PO) Take by mouth.    [name] GARLIC PO Take by mouth.    [name] scopolamine (TRANSDERM-SCOP) 1 MG/3DAYS Place 1 patch (1.5 mg total) onto the skin every 3 (three) days. 4 patch 1  . sertraline (ZOLOFT) 100 MG tablet Take 1.5 tablets (150 mg total) by mouth daily. 135 tablet 1  . simvastatin (ZOCOR) 40 MG tablet TAKE 1 TABLET AT BEDTIME KEEP SCHEDULED APPOINTMENT FOR FURTHER REFILLS 90 tablet 3   No current facility-administered medications for this visit.    Medication Side Effects: None  Allergies: No Known Allergies  Past Medical History:  Diagnosis Date  . Depression   . Hyperlipidemia   . Hypertension   . Rectal bleeding     Past Medical History, Surgical history, Social history, and Family history were reviewed and updated as appropriate.   Please see review of systems for further details on the patient's review from today.   Objective:   Physical Exam:  There were no vitals taken for this visit.  Physical Exam Constitutional:      General: He is not in acute distress. Musculoskeletal:        General: No deformity.  Neurological:     Mental Status: He is alert and oriented  to person, place, and time.     Coordination: Coordination normal.  Psychiatric:        Attention and Perception: Attention and perception normal. He does not perceive auditory or visual hallucinations.        Mood and Affect: Affect is not labile, blunt, angry or inappropriate.        Speech: Speech normal.        Behavior: Behavior normal.        Thought Content: Thought content normal. Thought content is not paranoid or delusional. Thought content does not include homicidal or suicidal ideation. Thought content does not include homicidal or suicidal plan.        Cognition  and Memory: Cognition and memory normal.        Judgment: Judgment normal.     Comments: Insight intact Mood is appropriate to content. Affect is congruent.    Lab Review:     Component Value Date/Time   NA 139 04/13/2022 1007   K 3.9 04/13/2022 1007   CL 102 04/13/2022 1007   CO2 30 04/13/2022 1007   GLUCOSE 91 04/13/2022 1007   BUN 16 04/13/2022 1007   CREATININE 1.14 04/13/2022 1007   CALCIUM 9.2 04/13/2022 1007   PROT 6.9 04/13/2022 1007   ALBUMIN 4.4 04/13/2022 1007   AST 22 04/13/2022 1007   ALT 17 04/13/2022 1007   ALKPHOS 68 04/13/2022 1007   BILITOT 0.5 04/13/2022 1007   GFRNONAA 71.85 08/25/2010 0748   GFRAA 93 08/04/2008 1635       Component Value Date/Time   WBC 7.1 01/05/2022 1022   RBC 5.12 01/05/2022 1022   HGB 14.8 01/05/2022 1022   HCT 44.0 01/05/2022 1022   PLT 184.0 01/05/2022 1022   MCV 86.0 01/05/2022 1022   MCHC 33.6 01/05/2022 1022   RDW 14.2 01/05/2022 1022   LYMPHSABS 1.7 01/05/2022 1022   MONOABS 0.7 01/05/2022 1022   EOSABS 0.3 01/05/2022 1022   BASOSABS 0.1 01/05/2022 1022    No results found for: "POCLITH", "LITHIUM"   No results found for: "PHENYTOIN", "PHENOBARB", "VALPROATE", "CBMZ"   .res Assessment: Plan:    Pt seen for 30 minutes and discussed recent anxiety and depressive s/s. He reports that his mood and anxiety has improved after some work place stressors have resolved. He reports that symptoms have almost returned to baseline  Kenneth Orozco was seen today for anxiety and depression.  Diagnoses and all orders for this visit:  Recurrent major depressive disorder, in partial remission ([place])     Please see After Visit Summary for patient specific instructions.  Future Appointments  Date Time Provider [place]  08/09/2022  4:00 PM Thayer Headings, PMHNP CP-CP None  10/22/2022  8:30 AM Thayer Headings, PMHNP CP-CP None    No orders of the defined types were placed in this  encounter.   -------------------------------

## 2022-08-06 ENCOUNTER — Other Ambulatory Visit: Payer: Self-pay | Admitting: Psychiatry

## 2022-08-06 DIAGNOSIS — F3341 Major depressive disorder, recurrent, in partial remission: Secondary | ICD-10-CM

## 2022-08-09 ENCOUNTER — Ambulatory Visit: Payer: BC Managed Care – PPO | Admitting: Psychiatry

## 2022-08-17 DIAGNOSIS — D176 Benign lipomatous neoplasm of spermatic cord: Secondary | ICD-10-CM | POA: Diagnosis not present

## 2022-08-17 DIAGNOSIS — N400 Enlarged prostate without lower urinary tract symptoms: Secondary | ICD-10-CM | POA: Diagnosis not present

## 2022-08-17 DIAGNOSIS — R972 Elevated prostate specific antigen [PSA]: Secondary | ICD-10-CM | POA: Diagnosis not present

## 2022-08-30 DIAGNOSIS — L720 Epidermal cyst: Secondary | ICD-10-CM | POA: Diagnosis not present

## 2022-08-30 DIAGNOSIS — L859 Epidermal thickening, unspecified: Secondary | ICD-10-CM | POA: Diagnosis not present

## 2022-08-30 DIAGNOSIS — L578 Other skin changes due to chronic exposure to nonionizing radiation: Secondary | ICD-10-CM | POA: Diagnosis not present

## 2022-08-30 DIAGNOSIS — D229 Melanocytic nevi, unspecified: Secondary | ICD-10-CM | POA: Diagnosis not present

## 2022-10-22 ENCOUNTER — Ambulatory Visit: Payer: BC Managed Care – PPO | Admitting: Psychiatry

## 2022-10-22 ENCOUNTER — Encounter: Payer: Self-pay | Admitting: Psychiatry

## 2022-10-22 DIAGNOSIS — F3342 Major depressive disorder, recurrent, in full remission: Secondary | ICD-10-CM | POA: Diagnosis not present

## 2022-10-22 NOTE — Progress Notes (Unsigned)
Kenneth Orozco [id number] [birth year] 61 y.o.  Subjective:   Patient ID:  Kenneth Orozco is a 61 y.o. (DOB [birth year]) male.  Chief Complaint:  Chief Complaint  Patient presents with   Follow-up    Anxiety and depression    HPI Kenneth Orozco presents to the office today for follow-up of depression and anxiety. Kenneth Orozco reports that Kenneth Orozco is "more of less ok." Kenneth Orozco reports that there was a staff change at work that Kenneth Orozco thinks will reduce his stress. Kenneth Orozco reports "my worry is different." Kenneth Orozco reports that Kenneth Orozco is thinking about world events more and this causes some worry.   Denies depression. Kenneth Orozco reports that Kenneth Orozco is going to bed later and this decreases sleep amount. Appetite has been ok. Kenneth Orozco reports that Kenneth Orozco has been feeling"tired." Concentration has been ok. Denies SI.   Kenneth Orozco has been playing soccer about once a week and hopes to increase to twice a week.   [place] Office Visit from 04/10/2022 in [place] at [facility] Visit from 01/05/2022 in [place] at [facility] Visit from 12/28/2020 in [place] at [facility] Total Score 0 0 0  PHQ-9 Total Score 0 0 0        Review of Systems:  Review of Systems  Gastrointestinal: Negative.   Musculoskeletal:  Negative for gait problem.  Neurological:        Kenneth Orozco reports occ "micro tremor" when Kenneth Orozco is in a certain position at his desk  Psychiatric/Behavioral:         Please refer to HPI    Medications: I have reviewed the patient's current medications.  Current Outpatient Medications  Medication Sig Dispense Refill   Cholecalciferol (VITAMIN D3) 2000 UNITS capsule Take 1 capsule (2,000 Units total) by mouth daily. 100 capsule 3   Cyanocobalamin (VITAMIN B 12 PO) Take by mouth.     GARLIC PO Take by mouth.     sertraline (ZOLOFT) 100 MG tablet TAKE ONE AND ONE-HALF TABLETS DAILY 135 tablet 1   simvastatin (ZOCOR) 40 MG tablet TAKE 1 TABLET AT BEDTIME KEEP SCHEDULED APPOINTMENT FOR FURTHER REFILLS 90  tablet 3   scopolamine (TRANSDERM-SCOP) 1 MG/3DAYS Place 1 patch (1.5 mg total) onto the skin every 3 (three) days. (Patient not taking: Reported on 10/22/2022) 4 patch 1   No current facility-administered medications for this visit.    Medication Side Effects: None  Allergies: No Known Allergies  Past Medical History:  Diagnosis Date   Depression    Hyperlipidemia    Hypertension    Rectal bleeding     Past Medical History, Surgical history, Social history, and Family history were reviewed and updated as appropriate.   Please see review of systems for further details on the patient's review from today.   Objective:   Physical Exam:  There were no vitals taken for this visit.  Physical Exam Constitutional:      General: Kenneth Orozco is not in acute distress. Musculoskeletal:        General: No deformity.  Neurological:     Mental Status: Kenneth Orozco is alert and oriented to person, place, and time.     Coordination: Coordination normal.  Psychiatric:        Attention and Perception: Attention and perception normal. Kenneth Orozco does not perceive auditory or visual hallucinations.        Mood and Affect: Mood normal. Mood is not anxious or depressed. Affect is not labile, blunt, angry or inappropriate.  Speech: Speech normal.        Behavior: Behavior normal.        Thought Content: Thought content normal. Thought content is not paranoid or delusional. Thought content does not include homicidal or suicidal ideation. Thought content does not include homicidal or suicidal plan.        Cognition and Memory: Cognition and memory normal.        Judgment: Judgment normal.     Comments: Insight intact     Lab Review:     Component Value Date/Time   NA 139 04/13/2022 1007   K 3.9 04/13/2022 1007   CL 102 04/13/2022 1007   CO2 30 04/13/2022 1007   GLUCOSE 91 04/13/2022 1007   BUN 16 04/13/2022 1007   CREATININE 1.14 04/13/2022 1007   CALCIUM 9.2 04/13/2022 1007   PROT 6.9 04/13/2022 1007    ALBUMIN 4.4 04/13/2022 1007   AST 22 04/13/2022 1007   ALT 17 04/13/2022 1007   ALKPHOS 68 04/13/2022 1007   BILITOT 0.5 04/13/2022 1007   GFRNONAA 71.85 08/25/2010 0748   GFRAA 93 08/04/2008 1635       Component Value Date/Time   WBC 7.1 01/05/2022 1022   RBC 5.12 01/05/2022 1022   HGB 14.8 01/05/2022 1022   HCT 44.0 01/05/2022 1022   PLT 184.0 01/05/2022 1022   MCV 86.0 01/05/2022 1022   MCHC 33.6 01/05/2022 1022   RDW 14.2 01/05/2022 1022   LYMPHSABS 1.7 01/05/2022 1022   MONOABS 0.7 01/05/2022 1022   EOSABS 0.3 01/05/2022 1022   BASOSABS 0.1 01/05/2022 1022    No results found for: "POCLITH", "LITHIUM"   No results found for: "PHENYTOIN", "PHENOBARB", "VALPROATE", "CBMZ"   .res Assessment: Plan:    Pt seen for 30 minutes and time spent discussing mood symptoms in response to recent changes at work and decrease in psychosocial stressors.  Patient reports that overall his mood and anxiety symptoms have been well-controlled and Kenneth Orozco would like to continue current medications without changes. Will continue sertraline 150 mg daily for anxiety and depression. Pt to follow-up in 6 months or sooner if clinically indicated.  Patient advised to contact office with any questions, adverse effects, or acute worsening in signs and symptoms.   [name] was seen today for follow-up.  Diagnoses and all orders for this visit:  Recurrent major depressive disorder, in full remission [facility])     Please see After Visit Summary for patient specific instructions.  Future Appointments  Date Time Provider [place]  04/23/2023  8:30 AM Thayer Headings, PMHNP CP-CP None    No orders of the defined types were placed in this encounter.   -------------------------------

## 2022-12-19 ENCOUNTER — Other Ambulatory Visit: Payer: Self-pay | Admitting: Internal Medicine

## 2022-12-31 ENCOUNTER — Ambulatory Visit (INDEPENDENT_AMBULATORY_CARE_PROVIDER_SITE_OTHER): Payer: BC Managed Care – PPO

## 2022-12-31 ENCOUNTER — Ambulatory Visit: Payer: BC Managed Care – PPO | Admitting: Internal Medicine

## 2022-12-31 ENCOUNTER — Encounter: Payer: Self-pay | Admitting: Internal Medicine

## 2022-12-31 VITALS — BP 122/78 | HR 79 | Temp 99.5°F | Ht 64.0 in | Wt 181.0 lb

## 2022-12-31 DIAGNOSIS — R052 Subacute cough: Secondary | ICD-10-CM

## 2022-12-31 DIAGNOSIS — R059 Cough, unspecified: Secondary | ICD-10-CM | POA: Insufficient documentation

## 2022-12-31 DIAGNOSIS — E785 Hyperlipidemia, unspecified: Secondary | ICD-10-CM

## 2022-12-31 DIAGNOSIS — R251 Tremor, unspecified: Secondary | ICD-10-CM | POA: Diagnosis not present

## 2022-12-31 MED ORDER — ALBUTEROL SULFATE HFA 108 (90 BASE) MCG/ACT IN AERS: 2.0000 | INHALATION_SPRAY | Freq: Four times a day (QID) | RESPIRATORY_TRACT | 0 refills | Status: DC | PRN

## 2022-12-31 NOTE — Assessment & Plan Note (Addendum)
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

## 2022-12-31 NOTE — Assessment & Plan Note (Signed)
Very mild.  Seems to be familial.  Diagnostic/treatment options discussed.  Will watch for now. Neurol ref offered

## 2022-12-31 NOTE — Progress Notes (Signed)
Subjective:  [name], male    DOB: Dec 27, 1960  Age: 62 y.o. MRN: [id number]  CC: Cough (Has been lingering from having covid 2-3 months ago)   HPI Kenneth Orozco presents for coughing spells after COVID 1-2/week 3 min long Kenneth Orozco had COVID 2 mo ago Follow-up on dyslipidemia  C/o L tremor at a certain position  Outpatient Medications Prior to Visit  Medication Sig Dispense Refill   Cholecalciferol (VITAMIN D3) 2000 UNITS capsule Take 1 capsule (2,000 Units total) by mouth daily. 100 capsule 3   Cyanocobalamin (VITAMIN B 12 PO) Take by mouth.     GARLIC PO Take by mouth.     sertraline (ZOLOFT) 100 MG tablet TAKE ONE AND ONE-HALF TABLETS DAILY 135 tablet 1   simvastatin (ZOCOR) 40 MG tablet Take 1 tablet (40 mg total) by mouth daily at 6 PM. Annual appt due in Feb must see provider for future refills 90 tablet 0   scopolamine (TRANSDERM-SCOP) 1 MG/3DAYS Place 1 patch (1.5 mg total) onto the skin every 3 (three) days. (Patient not taking: Reported on 10/22/2022) 4 patch 1   No facility-administered medications prior to visit.    ROS: Review of Systems  Constitutional:  Negative for appetite change, fatigue and unexpected weight change.  HENT:  Negative for congestion, nosebleeds, sneezing, sore throat and trouble swallowing.   Eyes:  Negative for itching and visual disturbance.  Respiratory:  Negative for cough.   Cardiovascular:  Negative for chest pain, palpitations and leg swelling.  Gastrointestinal:  Negative for abdominal distention, blood in stool, diarrhea and nausea.  Genitourinary:  Negative for frequency and hematuria.  Musculoskeletal:  Negative for back pain, gait problem, joint swelling and neck pain.  Skin:  Negative for rash.  Neurological:  Positive for tremors. Negative for dizziness, speech difficulty and weakness.  Psychiatric/Behavioral:  Negative for agitation, dysphoric mood and sleep disturbance. The patient is not nervous/anxious.      Objective:  BP 122/78 (BP Location: Right Arm, Patient Position: Sitting, Cuff Size: Normal)   Pulse 79   Temp 99.5 F (37.5 C) (Oral)   Ht '5\' 4"'$  (1.626 m)   Wt 181 lb (82.1 kg)   SpO2 98%   BMI 31.07 kg/m   BP Readings from Last 3 Encounters:  12/31/22 122/78  04/10/22 (!) 160/98  01/05/22 120/80    Wt Readings from Last 3 Encounters:  12/31/22 181 lb (82.1 kg)  04/10/22 187 lb 9.6 oz (85.1 kg)  01/05/22 191 lb (86.6 kg)    Physical Exam Constitutional:      General: He is not in acute distress.    Appearance: He is well-developed.     Comments: NAD  Eyes:     Conjunctiva/sclera: Conjunctivae normal.     Pupils: Pupils are equal, round, and reactive to light.  Neck:     Thyroid: No thyromegaly.     Vascular: No JVD.  Cardiovascular:     Rate and Rhythm: Normal rate and regular rhythm.     Heart sounds: Normal heart sounds. No murmur heard.    No friction rub. No gallop.  Pulmonary:     Effort: Pulmonary effort is normal. No respiratory distress.     Breath sounds: Normal breath sounds. No wheezing or rales.  Chest:     Chest wall: No tenderness.  Abdominal:     General: Bowel sounds are normal. There is no distension.     Palpations: Abdomen is soft. There is no mass.  Tenderness: There is no abdominal tenderness. There is no guarding or rebound.  Musculoskeletal:        General: No tenderness. Normal range of motion.     Cervical back: Normal range of motion.  Lymphadenopathy:     Cervical: No cervical adenopathy.  Skin:    General: Skin is warm and dry.     Findings: No rash.  Neurological:     Mental Status: He is alert and oriented to person, place, and time.     Cranial Nerves: No cranial nerve deficit.     Motor: No abnormal muscle tone.     Coordination: Coordination normal.     Gait: Gait normal.     Deep Tendon Reflexes: Reflexes are normal and symmetric.  Psychiatric:        Behavior: Behavior normal.        Thought Content: Thought  content normal.        Judgment: Judgment normal.    Very mild hand tremor is present  Lab Results  Component Value Date   WBC 7.1 01/05/2022   HGB 14.8 01/05/2022   HCT 44.0 01/05/2022   PLT 184.0 01/05/2022   GLUCOSE 91 04/13/2022   CHOL 194 01/05/2022   TRIG 126.0 01/05/2022   HDL 57.00 01/05/2022   LDLDIRECT 145.6 08/01/2012   LDLCALC 112 (H) 01/05/2022   ALT 17 04/13/2022   AST 22 04/13/2022   NA 139 04/13/2022   K 3.9 04/13/2022   CL 102 04/13/2022   CREATININE 1.14 04/13/2022   BUN 16 04/13/2022   CO2 30 04/13/2022   TSH 1.18 01/05/2022   PSA 6.55 (H) 01/05/2022   HGBA1C 5.8 04/13/2022    MR PROSTATE W WO CONTRAST  Result Date: 04/23/2022 CLINICAL DATA:  Elevated PSA equal 67.76.  62 year old male. EXAM: MR PROSTATE WITHOUT AND WITH CONTRAST TECHNIQUE: Multiplanar multisequence MRI images were obtained of the pelvis centered about the prostate. Pre and post contrast images were obtained. CONTRAST:  20m MULTIHANCE GADOBENATE DIMEGLUMINE 529 MG/ML IV SOLN COMPARISON:  None Available. FINDINGS: Prostate: Peripheral zone: No foci of restricted diffusion within the peripheral zone (series 6). Normal high signal intensity in the peripheral zone on T2 weighted imaging (series 8). No abnormal enhancement within the peripheral zone (series 11). Transitional zone:The transitional zone is enlarged by capsulated nodules without suspicious imaging characteristics on T2 weighted imaging. Volume: 4.9 x 4.3 x 4.4 cm (volume = 49 cm^3) Transcapsular spread:  Absent Seminal vesicle involvement: Absent Neurovascular bundle involvement: Absent Pelvic adenopathy: Absent Bone metastasis: Absent Other findings: None IMPRESSION: 1. No high-grade carcinoma identified in the peripheral zone. PI-RADS: 1. 2. Enlarged nodular transitional zone most consistent with benign prostate hypertrophy. PI-RADS: 2 Electronically Signed   By: SSuzy BouchardM.D.   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  Diagnostic/treatment options discussed.  Will watch for now. Neurol ref offered      Dyslipidemia    Continue with simvastatin      Cough    Post COVID cough versus other  Obtain CXR Ventolin MDI prn                       Relevant Orders   DG Chest 2 View (Completed)      Meds ordered this encounter  Medications  albuterol (VENTOLIN HFA) 108 (90 Base) MCG/ACT inhaler    Sig: Inhale 2 puffs into the lungs every 6 (six) hours as needed for wheezing or shortness of breath.    Dispense:  8 g    Refill:  0      Follow-up: No follow-ups on file.  Walker Kehr, MD

## 2023-01-07 ENCOUNTER — Telehealth: Payer: Self-pay | Admitting: Internal Medicine

## 2023-01-07 MED ORDER — SIMVASTATIN 40 MG PO TABS: 40.0000 mg | ORAL_TABLET | Freq: Every day | ORAL | 3 refills | Status: DC

## 2023-01-07 NOTE — Telephone Encounter (Signed)
Pt called requesting for Rx simvastatin (ZOCOR) 40 MG tablet to be refilled  Please call pt with update

## 2023-01-07 NOTE — Telephone Encounter (Signed)
[facility]..Kenneth Orozco

## 2023-01-13 ENCOUNTER — Encounter: Payer: Self-pay | Admitting: Internal Medicine

## 2023-01-13 NOTE — Assessment & Plan Note (Signed)
Continue with simvastatin

## 2023-02-01 ENCOUNTER — Other Ambulatory Visit: Payer: Self-pay | Admitting: Psychiatry

## 2023-02-01 DIAGNOSIS — F3341 Major depressive disorder, recurrent, in partial remission: Secondary | ICD-10-CM

## 2023-04-23 ENCOUNTER — Ambulatory Visit: Payer: BC Managed Care – PPO | Admitting: Psychiatry

## 2023-04-23 DIAGNOSIS — R972 Elevated prostate specific antigen [PSA]: Secondary | ICD-10-CM | POA: Diagnosis not present

## 2023-05-08 DIAGNOSIS — N400 Enlarged prostate without lower urinary tract symptoms: Secondary | ICD-10-CM | POA: Diagnosis not present

## 2023-05-08 DIAGNOSIS — R972 Elevated prostate specific antigen [PSA]: Secondary | ICD-10-CM | POA: Diagnosis not present

## 2023-05-08 DIAGNOSIS — D176 Benign lipomatous neoplasm of spermatic cord: Secondary | ICD-10-CM | POA: Diagnosis not present

## 2023-05-14 ENCOUNTER — Ambulatory Visit (INDEPENDENT_AMBULATORY_CARE_PROVIDER_SITE_OTHER): Payer: BC Managed Care – PPO | Admitting: Psychiatry

## 2023-05-14 ENCOUNTER — Encounter: Payer: Self-pay | Admitting: Psychiatry

## 2023-05-14 DIAGNOSIS — F3342 Major depressive disorder, recurrent, in full remission: Secondary | ICD-10-CM

## 2023-05-14 NOTE — Progress Notes (Signed)
Kenneth Orozco [id number] 05-17-1961 62 y.o.  Subjective:   Patient ID:  Kenneth Orozco is a 62 y.o. (DOB Jul 24, 1961) male.  Chief Complaint:  Chief Complaint  Patient presents with   Follow-up    Depression and anxiety    HPI Kenneth Orozco presents to the office today for follow-up of anxiety and depression. He reports that he has been doing "better." He reports that there have been management changes at work that have been positive. Reports new management seems to be more supportive. He reports the "majority" of his anxiety and mood is related to work. He anticipates improved anxiety as work continues to improve. Denies panic. Denies persistent depressed mood. He reports that he occasionally will feel down in response to an identifiable trigger for no longer than a day. Denies irritability. He reports that his sleep has been ok. He continues to work on going to bed earlier. Appetite has been ok. Concentration has been adequate. Denies SI.   No longer playing on soccer team "because of my age." He is looking for a league for people that are in their 50's-60's. He runs periodically. Considering starting pickleball.    PHQ2-9    Flowsheet Row Office Visit from 12/31/2022 in [facility] Visit from 04/10/2022 in [facility] Office Visit from 01/05/2022 in [facility] at [facility] Visit from 12/28/2020 in [facility] at [facility]  PHQ-2 Total Score 0 0 0 0  PHQ-9 Total Score 0 0 0 0        Review of Systems:  Review of Systems  Musculoskeletal:  Negative for gait problem.  Neurological:        Mild hand tremor  Psychiatric/Behavioral:         Please refer to HPI    Medications: I have reviewed the patient's current medications.  Current Outpatient Medications  Medication Sig Dispense Refill   Cholecalciferol (VITAMIN D3) 2000 UNITS capsule Take 1 capsule (2,000 Units  total) by mouth daily. 100 capsule 3   Cyanocobalamin (VITAMIN B 12 PO) Take by mouth.     GARLIC PO Take by mouth.     sertraline (ZOLOFT) 100 MG tablet TAKE ONE AND ONE-HALF TABLETS DAILY 135 tablet 3   simvastatin (ZOCOR) 40 MG tablet Take 1 tablet (40 mg total) by mouth daily at 6 PM. 90 tablet 3   albuterol (VENTOLIN HFA) 108 (90 Base) MCG/ACT inhaler Inhale 2 puffs into the lungs every 6 (six) hours as needed for wheezing or shortness of breath. (Patient not taking: Reported on 05/14/2023) 8 g 0   scopolamine (TRANSDERM-SCOP) 1 MG/3DAYS Place 1 patch (1.5 mg total) onto the skin every 3 (three) days. (Patient not taking: Reported on 10/22/2022) 4 patch 1   No current facility-administered medications for this visit.    Medication Side Effects: None  Allergies: No Known Allergies  Past Medical History:  Diagnosis Date   Depression    Hyperlipidemia    Hypertension    Rectal bleeding     Past Medical History, Surgical history, Social history, and Family history were reviewed and updated as appropriate.   Please see review of systems for further details on the patient's review from today.   Objective:   Physical Exam:  There were no vitals taken for this visit.  Physical Exam Constitutional:      General: He is not in acute distress. Musculoskeletal:  General: No deformity.  Neurological:     Mental Status: He is alert and oriented to person, place, and time.     Coordination: Coordination normal.  Psychiatric:        Attention and Perception: Attention and perception normal. He does not perceive auditory or visual hallucinations.        Mood and Affect: Mood normal. Mood is not anxious or depressed. Affect is not labile, blunt, angry or inappropriate.        Speech: Speech normal.        Behavior: Behavior normal.        Thought Content: Thought content normal. Thought content is not paranoid or delusional. Thought content does not include homicidal or suicidal  ideation. Thought content does not include homicidal or suicidal plan.        Cognition and Memory: Cognition and memory normal.        Judgment: Judgment normal.     Comments: Insight intact     Lab Review:     Component Value Date/Time   NA 139 04/13/2022 1007   K 3.9 04/13/2022 1007   CL 102 04/13/2022 1007   CO2 30 04/13/2022 1007   GLUCOSE 91 04/13/2022 1007   BUN 16 04/13/2022 1007   CREATININE 1.14 04/13/2022 1007   CALCIUM 9.2 04/13/2022 1007   PROT 6.9 04/13/2022 1007   ALBUMIN 4.4 04/13/2022 1007   AST 22 04/13/2022 1007   ALT 17 04/13/2022 1007   ALKPHOS 68 04/13/2022 1007   BILITOT 0.5 04/13/2022 1007   GFRNONAA 71.85 08/25/2010 0748   GFRAA 93 08/04/2008 1635       Component Value Date/Time   WBC 7.1 01/05/2022 1022   RBC 5.12 01/05/2022 1022   HGB 14.8 01/05/2022 1022   HCT 44.0 01/05/2022 1022   PLT 184.0 01/05/2022 1022   MCV 86.0 01/05/2022 1022   MCHC 33.6 01/05/2022 1022   RDW 14.2 01/05/2022 1022   LYMPHSABS 1.7 01/05/2022 1022   MONOABS 0.7 01/05/2022 1022   EOSABS 0.3 01/05/2022 1022   BASOSABS 0.1 01/05/2022 1022    No results found for: "POCLITH", "LITHIUM"   No results found for: "PHENYTOIN", "PHENOBARB", "VALPROATE", "CBMZ"   .res Assessment: Plan:    I spent 27 minutes dedicated to the care of this patient on the date of this  encounter to include pre-visit review of records, face-to-face time with the patient discussing the effects of work stress on his mood and anxiety, considering other forms of physical activity to manage stress since he is no longer playing in soccer league, ordering of medication, and post visit documentation. Continue Sertraline 150 mg daily for depression.  Pt to follow-up in 6 months or sooner if clinically indicated.  Patient advised to contact office with any questions, adverse effects, or acute worsening in signs and symptoms.    Kenneth Orozco was seen today for follow-up.  Diagnoses and all orders for this  visit:  Recurrent major depressive disorder, in full remission [facility])     Please see After Visit Summary for patient specific instructions.  Future Appointments  Date Time Provider Department Center  10/28/2023  8:30 AM Corie Chiquito, PMHNP CP-CP None    No orders of the defined types were placed in this encounter.   -------------------------------

## 2023-07-29 DIAGNOSIS — N400 Enlarged prostate without lower urinary tract symptoms: Secondary | ICD-10-CM | POA: Diagnosis not present

## 2023-08-05 DIAGNOSIS — R972 Elevated prostate specific antigen [PSA]: Secondary | ICD-10-CM | POA: Diagnosis not present

## 2023-08-26 ENCOUNTER — Telehealth: Payer: Self-pay | Admitting: Internal Medicine

## 2023-08-26 NOTE — Telephone Encounter (Signed)
Prescription Request  08/26/2023  LOV: 12/31/2022  What is the name of the medication or equipment? simvastatin  Have you contacted your pharmacy to request a refill? Yes   Which pharmacy would you like this sent to?  [facility] - [place], [place] - [address] [place] [address] Phone: [phone] Fax: [phone]     Patient notified that their request is being sent to the clinical staff for review and that they should receive a response within 2 business days.   Please advise at Mobile [phone] (mobile)

## 2023-08-27 ENCOUNTER — Other Ambulatory Visit: Payer: Self-pay

## 2023-08-27 MED ORDER — SIMVASTATIN 40 MG PO TABS: 40.0000 mg | ORAL_TABLET | Freq: Every day | ORAL | 3 refills | Status: DC

## 2023-09-18 DIAGNOSIS — L578 Other skin changes due to chronic exposure to nonionizing radiation: Secondary | ICD-10-CM | POA: Diagnosis not present

## 2023-09-18 DIAGNOSIS — D2371 Other benign neoplasm of skin of right lower limb, including hip: Secondary | ICD-10-CM | POA: Diagnosis not present

## 2023-09-18 DIAGNOSIS — L821 Other seborrheic keratosis: Secondary | ICD-10-CM | POA: Diagnosis not present

## 2023-09-18 DIAGNOSIS — D225 Melanocytic nevi of trunk: Secondary | ICD-10-CM | POA: Diagnosis not present

## 2023-09-26 DIAGNOSIS — R972 Elevated prostate specific antigen [PSA]: Secondary | ICD-10-CM | POA: Diagnosis not present

## 2023-10-02 ENCOUNTER — Encounter: Payer: Self-pay | Admitting: Psychiatry

## 2023-10-21 ENCOUNTER — Encounter: Payer: Self-pay | Admitting: Psychiatry

## 2023-10-21 ENCOUNTER — Ambulatory Visit: Payer: BC Managed Care – PPO | Admitting: Psychiatry

## 2023-10-21 DIAGNOSIS — F3341 Major depressive disorder, recurrent, in partial remission: Secondary | ICD-10-CM | POA: Diagnosis not present

## 2023-10-21 MED ORDER — SERTRALINE HCL 100 MG PO TABS: 150.0000 mg | ORAL_TABLET | Freq: Every day | ORAL | 3 refills | Status: AC

## 2023-10-21 NOTE — Progress Notes (Signed)
Kenneth Orozco [id number] October 11, 1961 62 y.o.  Subjective:   Patient ID:  Kenneth Orozco is a 62 y.o. (DOB 1961-04-22) male.  Chief Complaint:  Chief Complaint  Patient presents with   Follow-up    Depression and anxiety    HPI Kenneth Orozco presents to the office today for follow-up of depression and anxiety. He reports that he experienced "down mood" about a month ago that lasted for about 1.5 weeks in the context of work stress. He reports that he has had some worry in response to workplace. He reports, "the anxiety causes the bad mood." Denies any chest tightness or physical symptoms of anxiety. He reports that worry has improved. Denies current sad mood.  He reports that his energy and motivation have been good. Appetite has been "ok." He reports adequate concentration. Denies SI.   He continues to play soccer and has found a new soccer league.    PHQ2-9    Flowsheet Row Office Visit from 12/31/2022 in [facility] Visit from 04/10/2022 in [facility] Office Visit from 01/05/2022 in [facility] Visit from 12/28/2020 in [facility]  PHQ-2 Total Score 0 0 0 0  PHQ-9 Total Score 0 0 0 0        Review of Systems:  Review of Systems  Gastrointestinal: Negative.   Musculoskeletal:  Negative for gait problem.  Neurological:  Negative for tremors.  Psychiatric/Behavioral:         Please refer to HPI    Medications: I have reviewed the patient's current medications.  Current Outpatient Medications  Medication Sig Dispense Refill   Cholecalciferol (VITAMIN D3) 2000 UNITS capsule Take 1 capsule (2,000 Units total) by mouth daily. 100 capsule 3   Cyanocobalamin (VITAMIN B 12 PO) Take by mouth.     GARLIC PO Take by mouth.     simvastatin (ZOCOR) 40 MG tablet Take 1 tablet (40 mg total) by mouth daily at 6 PM. 90 tablet 3   sertraline (ZOLOFT)  100 MG tablet Take 1.5 tablets (150 mg total) by mouth daily. 135 tablet 3   No current facility-administered medications for this visit.    Medication Side Effects: None  Allergies: No Known Allergies  Past Medical History:  Diagnosis Date   Depression    Hyperlipidemia    Hypertension    Rectal bleeding     Past Medical History, Surgical history, Social history, and Family history were reviewed and updated as appropriate.   Please see review of systems for further details on the patient's review from today.   Objective:   Physical Exam:  There were no vitals taken for this visit.  Physical Exam Constitutional:      General: He is not in acute distress. Musculoskeletal:        General: No deformity.  Neurological:     Mental Status: He is alert and oriented to person, place, and time.     Coordination: Coordination normal.  Psychiatric:        Attention and Perception: Attention and perception normal. He does not perceive auditory or visual hallucinations.        Mood and Affect: Mood normal. Mood is not anxious or depressed. Affect is not labile, blunt, angry or inappropriate.        Speech: Speech normal.        Behavior: Behavior normal.  Thought Content: Thought content normal. Thought content is not paranoid or delusional. Thought content does not include homicidal or suicidal ideation. Thought content does not include homicidal or suicidal plan.        Cognition and Memory: Cognition and memory normal.        Judgment: Judgment normal.     Comments: Insight intact     Lab Review:     Component Value Date/Time   NA 139 04/13/2022 1007   K 3.9 04/13/2022 1007   CL 102 04/13/2022 1007   CO2 30 04/13/2022 1007   GLUCOSE 91 04/13/2022 1007   BUN 16 04/13/2022 1007   CREATININE 1.14 04/13/2022 1007   CALCIUM 9.2 04/13/2022 1007   PROT 6.9 04/13/2022 1007   ALBUMIN 4.4 04/13/2022 1007   AST 22 04/13/2022 1007   ALT 17 04/13/2022 1007   ALKPHOS 68  04/13/2022 1007   BILITOT 0.5 04/13/2022 1007   GFRNONAA 71.85 08/25/2010 0748   GFRAA 93 08/04/2008 1635       Component Value Date/Time   WBC 7.1 01/05/2022 1022   RBC 5.12 01/05/2022 1022   HGB 14.8 01/05/2022 1022   HCT 44.0 01/05/2022 1022   PLT 184.0 01/05/2022 1022   MCV 86.0 01/05/2022 1022   MCHC 33.6 01/05/2022 1022   RDW 14.2 01/05/2022 1022   LYMPHSABS 1.7 01/05/2022 1022   MONOABS 0.7 01/05/2022 1022   EOSABS 0.3 01/05/2022 1022   BASOSABS 0.1 01/05/2022 1022    No results found for: "POCLITH", "LITHIUM"   No results found for: "PHENYTOIN", "PHENOBARB", "VALPROATE", "CBMZ"   .res Assessment: Plan:    Will continue Sertraline 150 mg daily for depression.  Discussed that this provider is leaving the practice. Plan is to transition care to Melony Over[place], [place].  Pt to follow-up in 6 months or sooner if clinically indicated.  Patient advised to contact office with any questions, adverse effects, or acute worsening in signs and symptoms.   Kenneth Orozco was seen today for follow-up.  Diagnoses and all orders for this visit:  Recurrent major depressive disorder, in partial remission (HCC) -     sertraline (ZOLOFT) 100 MG tablet; Take 1.5 tablets (150 mg total) by mouth daily.     Please see After Visit Summary for patient specific instructions.  Future Appointments  Date Time Provider Department Center  04/21/2024  3:30 PM Cherie Ouch, PA-C CP-CP None    No orders of the defined types were placed in this encounter.   -------------------------------

## 2023-10-28 ENCOUNTER — Ambulatory Visit: Payer: BC Managed Care – PPO | Admitting: Psychiatry

## 2024-01-17 ENCOUNTER — Encounter: Payer: BC Managed Care – PPO | Admitting: Internal Medicine

## 2024-01-24 ENCOUNTER — Encounter: Payer: Self-pay | Admitting: Internal Medicine

## 2024-01-24 ENCOUNTER — Ambulatory Visit: Payer: BC Managed Care – PPO | Admitting: Internal Medicine

## 2024-01-24 VITALS — BP 118/80 | HR 66 | Temp 98.6°F | Ht 64.0 in | Wt 174.0 lb

## 2024-01-24 DIAGNOSIS — H814 Vertigo of central origin: Secondary | ICD-10-CM

## 2024-01-24 DIAGNOSIS — K5730 Diverticulosis of large intestine without perforation or abscess without bleeding: Secondary | ICD-10-CM | POA: Insufficient documentation

## 2024-01-24 DIAGNOSIS — K2100 Gastro-esophageal reflux disease with esophagitis, without bleeding: Secondary | ICD-10-CM | POA: Insufficient documentation

## 2024-01-24 DIAGNOSIS — K2990 Gastroduodenitis, unspecified, without bleeding: Secondary | ICD-10-CM | POA: Insufficient documentation

## 2024-01-24 DIAGNOSIS — K219 Gastro-esophageal reflux disease without esophagitis: Secondary | ICD-10-CM | POA: Insufficient documentation

## 2024-01-24 DIAGNOSIS — F32A Depression, unspecified: Secondary | ICD-10-CM

## 2024-01-24 DIAGNOSIS — E785 Hyperlipidemia, unspecified: Secondary | ICD-10-CM | POA: Diagnosis not present

## 2024-01-24 DIAGNOSIS — Z0000 Encounter for general adult medical examination without abnormal findings: Secondary | ICD-10-CM

## 2024-01-24 DIAGNOSIS — E669 Obesity, unspecified: Secondary | ICD-10-CM | POA: Insufficient documentation

## 2024-01-24 DIAGNOSIS — K625 Hemorrhage of anus and rectum: Secondary | ICD-10-CM | POA: Insufficient documentation

## 2024-01-24 DIAGNOSIS — I10 Essential (primary) hypertension: Secondary | ICD-10-CM

## 2024-01-24 DIAGNOSIS — R1013 Epigastric pain: Secondary | ICD-10-CM | POA: Insufficient documentation

## 2024-01-24 LAB — COMPREHENSIVE METABOLIC PANEL
ALT: 17 U/L (ref 0–53)
AST: 20 U/L (ref 0–37)
Albumin: 4.6 g/dL (ref 3.5–5.2)
Alkaline Phosphatase: 58 U/L (ref 39–117)
BUN: 17 mg/dL (ref 6–23)
CO2: 26 meq/L (ref 19–32)
Calcium: 10 mg/dL (ref 8.4–10.5)
Chloride: 104 meq/L (ref 96–112)
Creatinine, Ser: 1.06 mg/dL (ref 0.40–1.50)
GFR: 75.29 mL/min (ref >60.00)
Glucose, Bld: 110 mg/dL — ABNORMAL HIGH (ref 70–99)
Potassium: 4.8 meq/L (ref 3.5–5.1)
Sodium: 143 meq/L (ref 135–145)
Total Bilirubin: 0.6 mg/dL (ref 0.2–1.2)
Total Protein: 7.3 g/dL (ref 6.0–8.3)

## 2024-01-24 LAB — URINALYSIS, ROUTINE W REFLEX MICROSCOPIC
Bilirubin Urine: NEGATIVE
Ketones, ur: NEGATIVE
Leukocytes,Ua: NEGATIVE
Nitrite: NEGATIVE
Specific Gravity, Urine: 1.02 (ref 1.000–1.030)
Total Protein, Urine: NEGATIVE
Urine Glucose: NEGATIVE
Urobilinogen, UA: 1 (ref 0.0–1.0)
pH: 6.5 (ref 5.0–8.0)

## 2024-01-24 LAB — CBC WITH DIFFERENTIAL/PLATELET
Basophils Absolute: 0 10*3/uL (ref 0.0–0.1)
Basophils Relative: 0.5 % (ref 0.0–3.0)
Eosinophils Absolute: 0.2 10*3/uL (ref 0.0–0.7)
Eosinophils Relative: 3.5 % (ref 0.0–5.0)
HCT: 45.6 % (ref 39.0–52.0)
Hemoglobin: 15.7 g/dL (ref 13.0–17.0)
Lymphocytes Relative: 17.4 % (ref 12.0–46.0)
Lymphs Abs: 1.2 10*3/uL (ref 0.7–4.0)
MCHC: 34.5 g/dL (ref 30.0–36.0)
MCV: 88.5 fl (ref 78.0–100.0)
Monocytes Absolute: 0.6 10*3/uL (ref 0.1–1.0)
Monocytes Relative: 8.9 % (ref 3.0–12.0)
Neutro Abs: 4.6 10*3/uL (ref 1.4–7.7)
Neutrophils Relative %: 69.7 % (ref 43.0–77.0)
Platelets: 177 10*3/uL (ref 150.0–400.0)
RBC: 5.15 Mil/uL (ref 4.22–5.81)
RDW: 13.4 % (ref 11.5–15.5)
WBC: 6.6 10*3/uL (ref 4.0–10.5)

## 2024-01-24 LAB — LIPID PANEL
Cholesterol: 197 mg/dL (ref 0–200)
HDL: 72.3 mg/dL (ref >39.00)
LDL Cholesterol: 107 mg/dL — ABNORMAL HIGH (ref 0–99)
NonHDL: 124.7
Total CHOL/HDL Ratio: 3
Triglycerides: 91 mg/dL (ref 0.0–149.0)
VLDL: 18.2 mg/dL (ref 0.0–40.0)

## 2024-01-24 LAB — TSH: TSH: 1.29 u[IU]/mL (ref 0.35–5.50)

## 2024-01-24 LAB — VITAMIN B12: Vitamin B-12: 366 pg/mL (ref 211–911)

## 2024-01-24 LAB — PSA: PSA: 11.56 ng/mL — ABNORMAL HIGH (ref 0.10–4.00)

## 2024-01-24 LAB — VITAMIN D 25 HYDROXY (VIT D DEFICIENCY, FRACTURES): VITD: 65.83 ng/mL (ref 30.00–100.00)

## 2024-01-24 MED ORDER — SIMVASTATIN 40 MG PO TABS: 40.0000 mg | ORAL_TABLET | Freq: Every day | ORAL | 3 refills | Status: AC

## 2024-01-24 NOTE — Assessment & Plan Note (Signed)
 C/o vertigo or lightheadedness episodes that only happen when he runs up the steps and last for several seconds x 2 years.  He plays soccer -no such symptoms from running on the wet surface.  No chest pain or shortness of breath.  No headache, no double vision Obtain vitamin B12, other labs Obtain head CT

## 2024-01-24 NOTE — Assessment & Plan Note (Signed)
Chronic Cont on Sertaline Regular exercise, rest

## 2024-01-24 NOTE — Assessment & Plan Note (Addendum)
   We discussed age appropriate health related issues, including available/recomended screening tests and vaccinations. Labs were ordered to be later reviewed . All questions were answered. We discussed one or more of the following - seat belt use, use of sunscreen/sun exposure exercise, fall risk reduction, second hand smoke exposure, firearm use and storage, seat belt use, a need for adhering to healthy diet and exercise. Labs were ordered.  All questions were answered.  A cardiac CT scan for coronary calcium offered 9/20, ordered 2/22 Dr Loreta Ave said colon is due in 2022. PSA per Dr Retta Diones. 2025 -Dr. Marlou Porch Prostate/rectal exam-Per urology

## 2024-01-24 NOTE — Assessment & Plan Note (Signed)
 Monitor BP at home Call if elevated

## 2024-01-24 NOTE — Assessment & Plan Note (Signed)
Continue with simvastatin 

## 2024-01-24 NOTE — Progress Notes (Addendum)
 Subjective:  [name], male    DOB: 1961-09-02  Age: 63 y.o. MRN: [id number]  CC: Annual Exam   HPI Kal Chait presents for a well exam C/o vertigo or lightheadedness episodes that only happen when he runs up the steps and last for several seconds x 2 years.  He plays soccer -no such symptoms from running on the flat surface.  No chest pain or shortness of breath.  No headache, no double vision  Outpatient Medications Prior to Visit  Medication Sig Dispense Refill   Cholecalciferol (VITAMIN D3) 2000 UNITS capsule Take 1 capsule (2,000 Units total) by mouth daily. 100 capsule 3   Cyanocobalamin  (VITAMIN B 12 PO) Take by mouth.     GARLIC PO Take by mouth.     sertraline  (ZOLOFT ) 100 MG tablet Take 1.5 tablets (150 mg total) by mouth daily. 135 tablet 3   simvastatin  (ZOCOR ) 40 MG tablet Take 1 tablet (40 mg total) by mouth daily at 6 PM. 90 tablet 3   No facility-administered medications prior to visit.    ROS: Review of Systems  Constitutional:  Negative for appetite change, fatigue and unexpected weight change.  HENT:  Negative for congestion, nosebleeds, sneezing, sore throat and trouble swallowing.   Eyes:  Negative for itching and visual disturbance.  Respiratory:  Negative for cough, choking and wheezing.   Cardiovascular:  Negative for chest pain, palpitations and leg swelling.  Gastrointestinal:  Negative for abdominal distention, blood in stool, diarrhea and nausea.  Genitourinary:  Negative for frequency and hematuria.  Musculoskeletal:  Negative for back pain, gait problem, joint swelling and neck pain.  Skin:  Negative for rash.  Neurological:  Positive for dizziness and light-headedness. Negative for tremors, seizures, syncope, speech difficulty and weakness.  Psychiatric/Behavioral:  Negative for agitation, dysphoric mood, sleep disturbance and suicidal ideas. The patient is not nervous/anxious.     Objective:  BP 118/80   Pulse 66   Temp 98.6 F  (37 C) (Oral)   Ht 5' 4 (1.626 m)   Wt 174 lb (78.9 kg)   SpO2 96%   BMI 29.87 kg/m   BP Readings from Last 3 Encounters:  01/24/24 118/80  12/31/22 122/78  04/10/22 (!) 160/98    Wt Readings from Last 3 Encounters:  01/24/24 174 lb (78.9 kg)  12/31/22 181 lb (82.1 kg)  04/10/22 187 lb 9.6 oz (85.1 kg)    Physical Exam Constitutional:      General: He is not in acute distress.    Appearance: Normal appearance. He is well-developed.     Comments: NAD  Eyes:     Conjunctiva/sclera: Conjunctivae normal.     Pupils: Pupils are equal, round, and reactive to light.  Neck:     Thyroid : No thyromegaly.     Vascular: No JVD.  Cardiovascular:     Rate and Rhythm: Normal rate and regular rhythm.     Heart sounds: Normal heart sounds. No murmur heard.    No friction rub. No gallop.  Pulmonary:     Effort: Pulmonary effort is normal. No respiratory distress.     Breath sounds: Normal breath sounds. No wheezing or rales.  Chest:     Chest wall: No tenderness.  Abdominal:     General: Bowel sounds are normal. There is no distension.     Palpations: Abdomen is soft. There is no mass.     Tenderness: There is no abdominal tenderness. There is no guarding or rebound.  Musculoskeletal:  General: No tenderness. Normal range of motion.     Cervical back: Normal range of motion.     Right lower leg: No edema.     Left lower leg: No edema.  Lymphadenopathy:     Cervical: No cervical adenopathy.  Skin:    General: Skin is warm and dry.     Findings: No rash.  Neurological:     Mental Status: He is alert and oriented to person, place, and time.     Cranial Nerves: No cranial nerve deficit.     Motor: No abnormal muscle tone.     Coordination: Coordination normal.     Gait: Gait normal.     Deep Tendon Reflexes: Reflexes are normal and symmetric.  Psychiatric:        Behavior: Behavior normal.        Thought Content: Thought content normal.        Judgment: Judgment  normal.    Romberg negative Good balance Heel-to-toe walking is normal No pronation drift No nystagmus  Prostate/rectal exam-Per urology  Lab Results  Component Value Date   WBC 7.1 01/05/2022   HGB 14.8 01/05/2022   HCT 44.0 01/05/2022   PLT 184.0 01/05/2022   GLUCOSE 91 04/13/2022   CHOL 194 01/05/2022   TRIG 126.0 01/05/2022   HDL 57.00 01/05/2022   LDLDIRECT 145.6 08/01/2012   LDLCALC 112 (H) 01/05/2022   ALT 17 04/13/2022   AST 22 04/13/2022   NA 139 04/13/2022   K 3.9 04/13/2022   CL 102 04/13/2022   CREATININE 1.14 04/13/2022   BUN 16 04/13/2022   CO2 30 04/13/2022   TSH 1.18 01/05/2022   PSA 6.55 (H) 01/05/2022   HGBA1C 5.8 04/13/2022    MR PROSTATE W WO CONTRAST Result Date: 04/23/2022 CLINICAL DATA:  Elevated PSA equal 71.84.  63 year old male. EXAM: MR PROSTATE WITHOUT AND WITH CONTRAST TECHNIQUE: Multiplanar multisequence MRI images were obtained of the pelvis centered about the prostate. Pre and post contrast images were obtained. CONTRAST:  17mL MULTIHANCE  GADOBENATE DIMEGLUMINE  529 MG/ML IV SOLN COMPARISON:  None Available. FINDINGS: Prostate: Peripheral zone: No foci of restricted diffusion within the peripheral zone (series 6). Normal high signal intensity in the peripheral zone on T2 weighted imaging (series 8). No abnormal enhancement within the peripheral zone (series 11). Transitional zone:The transitional zone is enlarged by capsulated nodules without suspicious imaging characteristics on T2 weighted imaging. Volume: 4.9 x 4.3 x 4.4 cm (volume = 49 cm^3) Transcapsular spread:  Absent Seminal vesicle involvement: Absent Neurovascular bundle involvement: Absent Pelvic adenopathy: Absent Bone metastasis: Absent Other findings: None IMPRESSION: 1. No high-grade carcinoma identified in the peripheral zone. PI-RADS: 1. 2. Enlarged nodular transitional zone most consistent with benign prostate hypertrophy. PI-RADS: 2 Electronically Signed   By: Jackquline Boxer M.D.    On: 04/23/2022 08:59    Assessment & Plan:   Problem List Items Addressed This Visit     Dyslipidemia - Primary   Continue with simvastatin       Relevant Medications   simvastatin  (ZOCOR ) 40 MG tablet   Chronic depression   Chronic Cont on Sertaline Regular exercise, rest      Essential hypertension   Monitor BP at home Call if elevated      Relevant Medications   simvastatin  (ZOCOR ) 40 MG tablet   Well adult exam     We discussed age appropriate health related issues, including available/recomended screening tests and vaccinations. Labs were ordered to be later reviewed . All questions  were answered. We discussed one or more of the following - seat belt use, use of sunscreen/sun exposure exercise, fall risk reduction, second hand smoke exposure, firearm use and storage, seat belt use, a need for adhering to healthy diet and exercise. Labs were ordered.  All questions were answered.  A cardiac CT scan for coronary calcium offered 9/20, ordered 2/22 Dr Kristie said colon is due in 2022. PSA per Dr Matilda. 2025 -Dr. Cam Prostate/rectal exam-Per urology      Relevant Orders   TSH   Urinalysis   CBC with Differential/Platelet   Lipid panel   PSA   Comprehensive metabolic panel   Vitamin B12   VITAMIN D  25 Hydroxy (Vit-D Deficiency, Fractures)   Vertigo of central origin   C/o vertigo or lightheadedness episodes that only happen when he runs up the steps and last for several seconds x 2 years.  He plays soccer -no such symptoms from running on the wet surface.  No chest pain or shortness of breath.  No headache, no double vision Obtain vitamin B12, other labs Obtain head CT      Relevant Orders   Vitamin B12   VITAMIN D  25 Hydroxy (Vit-D Deficiency, Fractures)   CT HEAD WO CONTRAST ( )      Meds ordered this encounter  Medications   simvastatin  (ZOCOR ) 40 MG tablet    Sig: Take 1 tablet (40 mg total) by mouth daily.    Dispense:  90 tablet    Refill:   3      Follow-up: Return in about 6 months (around 07/26/2024) for a follow-up visit.  Marolyn Noel, MD

## 2024-02-11 ENCOUNTER — Telehealth: Payer: Self-pay | Admitting: Internal Medicine

## 2024-02-11 NOTE — Telephone Encounter (Signed)
 Copied from CRM [id number]. Topic: Clinical - Prescription Issue >> Feb 11, 2024  3:36 PM Pascal Lux wrote: Reason for CRM: Bethann Berkshire from [facility] Pre-service line - said patient has an appointment 02/14/24 for a CT scan and is requiring a prior authorization from [facility]. Call back [phone].

## 2024-02-14 ENCOUNTER — Ambulatory Visit (HOSPITAL_BASED_OUTPATIENT_CLINIC_OR_DEPARTMENT_OTHER)

## 2024-03-15 ENCOUNTER — Encounter: Payer: Self-pay | Admitting: Internal Medicine

## 2024-03-17 ENCOUNTER — Other Ambulatory Visit: Payer: Self-pay | Admitting: Internal Medicine

## 2024-03-17 DIAGNOSIS — Z9229 Personal history of other drug therapy: Secondary | ICD-10-CM

## 2024-03-20 ENCOUNTER — Other Ambulatory Visit

## 2024-03-20 DIAGNOSIS — Z9229 Personal history of other drug therapy: Secondary | ICD-10-CM

## 2024-03-21 LAB — RUBEOLA ANTIBODY IGG: Rubeola IgG: <13.5 [AU]/ml — ABNORMAL LOW

## 2024-03-22 ENCOUNTER — Encounter: Payer: Self-pay | Admitting: Internal Medicine

## 2024-03-27 DIAGNOSIS — R972 Elevated prostate specific antigen [PSA]: Secondary | ICD-10-CM | POA: Diagnosis not present

## 2024-04-03 DIAGNOSIS — R972 Elevated prostate specific antigen [PSA]: Secondary | ICD-10-CM | POA: Diagnosis not present

## 2024-04-03 DIAGNOSIS — D176 Benign lipomatous neoplasm of spermatic cord: Secondary | ICD-10-CM | POA: Diagnosis not present

## 2024-04-21 ENCOUNTER — Ambulatory Visit: Payer: BC Managed Care – PPO | Admitting: Physician Assistant

## 2024-04-21 ENCOUNTER — Encounter: Payer: Self-pay | Admitting: Physician Assistant

## 2024-04-21 DIAGNOSIS — Z566 Other physical and mental strain related to work: Secondary | ICD-10-CM

## 2024-04-21 DIAGNOSIS — F3341 Major depressive disorder, recurrent, in partial remission: Secondary | ICD-10-CM | POA: Diagnosis not present

## 2024-04-21 NOTE — Progress Notes (Unsigned)
 [facility]  [name],  MRN: [id number]  PCP: Genia Kettering, MD  Date of Evaluation: 04/21/2024 Time spent:25 minutes  Chief Complaint:  Chief Complaint   Depression; Follow-up    HISTORY/CURRENT STATUS: HPI Transferring to my care from Roselyn Connor, NP who is is no longer with the practice.   Hx of depression for many years. Work has been a trigger off and on throughout all this time. 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  He has a Writer, that started a little over a year ago. They were doing ok for about a year.  Now that manager has accused him of calling him a Sales promotion account executive. The manager got HR involved, he has a meeting with HR and that manager tomorrow. He is triggered again     Individual Medical History/ Review of Systems: Changes? :No   Allergies: Patient has no known allergies.  Current Medications:  Current Outpatient Medications:    Cholecalciferol (VITAMIN D3) 2000 UNITS capsule, Take 1 capsule (2,000 Units total) by mouth daily., Disp: 100 capsule, Rfl: 3   Cyanocobalamin  (VITAMIN B 12 PO), Take by mouth., Disp: , Rfl:    GARLIC PO, Take by mouth., Disp: , Rfl:    sertraline  (ZOLOFT ) 100 MG tablet, Take 1.5 tablets (150 mg total) by mouth daily., Disp: 135 tablet, Rfl: 3   simvastatin  (ZOCOR ) 40 MG tablet, Take 1 tablet (40 mg total) by mouth daily., Disp: 90 tablet, Rfl: 3 Medication Side Effects: none  Family Medical/ Social History: Changes? No  MENTAL HEALTH EXAM:  There were no vitals taken for this visit.There is no height or weight on file to calculate BMI.  General Appearance: Casual and Well Groomed  Eye Contact:  Good  Speech:  Clear and Coherent and Normal Rate  Volume:  Normal  Mood:  {PSY:22306}  Affect:  {PSY:[id number]}  Thought Process:  {PSY:22688}  Orientation:  {PSY:22689}  Thought Content: {PSYt:22690}   Suicidal Thoughts:  {PSY:22692}  Homicidal Thoughts:  {PSY:22692}  Memory:  {[phone]}   Judgement:  {PSY:22694}  Insight:  {PSY:22695}  Psychomotor Activity:  {PSY:22696}  Concentration:  {PSY:21399}  Recall:  {PSY:22877}  Fund of Knowledge: {PSY:22877}  Language: {WUJ:81191}  Assets:  {PSY:22698}  ADL's:  {PSY:22290}  Cognition: {PSY:[id number]}  Prognosis:  {PSY:22877}   DIAGNOSES: No diagnosis found.  Receiving Psychotherapy: {YNW:29562}  RECOMMENDATIONS: *** PDMP reviewed.  No controlled substances.   Ok to increase the   Return in 3 months.  Marvia Slocumb, PA-C

## 2024-05-05 DIAGNOSIS — M25561 Pain in right knee: Secondary | ICD-10-CM | POA: Diagnosis not present

## 2024-06-25 DIAGNOSIS — Z1211 Encounter for screening for malignant neoplasm of colon: Secondary | ICD-10-CM | POA: Diagnosis not present

## 2024-06-25 DIAGNOSIS — K5730 Diverticulosis of large intestine without perforation or abscess without bleeding: Secondary | ICD-10-CM | POA: Diagnosis not present

## 2024-06-25 DIAGNOSIS — K219 Gastro-esophageal reflux disease without esophagitis: Secondary | ICD-10-CM | POA: Diagnosis not present

## 2024-07-08 DIAGNOSIS — K219 Gastro-esophageal reflux disease without esophagitis: Secondary | ICD-10-CM | POA: Diagnosis not present

## 2024-07-08 DIAGNOSIS — K2289 Other specified disease of esophagus: Secondary | ICD-10-CM | POA: Diagnosis not present

## 2024-07-08 DIAGNOSIS — Z860100 Personal history of colon polyps, unspecified: Secondary | ICD-10-CM | POA: Diagnosis not present

## 2024-07-08 DIAGNOSIS — Z1211 Encounter for screening for malignant neoplasm of colon: Secondary | ICD-10-CM | POA: Diagnosis not present

## 2024-07-08 DIAGNOSIS — K5730 Diverticulosis of large intestine without perforation or abscess without bleeding: Secondary | ICD-10-CM | POA: Diagnosis not present

## 2024-07-08 DIAGNOSIS — K2950 Unspecified chronic gastritis without bleeding: Secondary | ICD-10-CM | POA: Diagnosis not present

## 2024-07-14 ENCOUNTER — Ambulatory Visit: Admitting: Physician Assistant

## 2024-08-27 DIAGNOSIS — B9681 Helicobacter pylori [H. pylori] as the cause of diseases classified elsewhere: Secondary | ICD-10-CM | POA: Diagnosis not present

## 2024-09-24 DIAGNOSIS — L578 Other skin changes due to chronic exposure to nonionizing radiation: Secondary | ICD-10-CM | POA: Diagnosis not present

## 2024-09-24 DIAGNOSIS — D2371 Other benign neoplasm of skin of right lower limb, including hip: Secondary | ICD-10-CM | POA: Diagnosis not present

## 2024-09-24 DIAGNOSIS — D225 Melanocytic nevi of trunk: Secondary | ICD-10-CM | POA: Diagnosis not present

## 2024-09-24 DIAGNOSIS — L821 Other seborrheic keratosis: Secondary | ICD-10-CM | POA: Diagnosis not present

## 2024-10-02 DIAGNOSIS — R972 Elevated prostate specific antigen [PSA]: Secondary | ICD-10-CM | POA: Diagnosis not present

## 2024-10-09 DIAGNOSIS — R972 Elevated prostate specific antigen [PSA]: Secondary | ICD-10-CM | POA: Diagnosis not present

## 2024-10-09 DIAGNOSIS — N528 Other male erectile dysfunction: Secondary | ICD-10-CM | POA: Diagnosis not present

## 2024-10-19 ENCOUNTER — Ambulatory Visit: Admitting: Physician Assistant

## 2024-10-19 ENCOUNTER — Encounter: Payer: Self-pay | Admitting: Physician Assistant

## 2024-10-19 DIAGNOSIS — F3342 Major depressive disorder, recurrent, in full remission: Secondary | ICD-10-CM

## 2024-10-19 NOTE — Progress Notes (Signed)
 [facility]  [name],  MRN: [id number]  PCP: Garald Karlynn GAILS, MD  Date of Evaluation: 10/19/2024 Time spent:20 minutes  Chief Complaint:  Chief Complaint   Depression; Follow-up    HISTORY/CURRENT STATUS: HPI  For routine med check.  At the last visit he reported having more stress at work, and we were planning to increase the Zoloft  if it continued.  Things got better and he didn't feel like he needed to increase it.  He has been doing well on the current dose.  No anhedonia.  No reported sleep difficulties.  ADLs and personal hygiene are normal.   No changes in concentration, making decisions, or remembering things.  Appetite has not changed.  No anxiety reported.  No mania, delirium, AH/VH.  No SI/HI.  Individual Medical History/ Review of Systems: Changes? :No   Allergies: Patient has no known allergies.  Current Medications:  Current Outpatient Medications:    Cholecalciferol (VITAMIN D3) 2000 UNITS capsule, Take 1 capsule (2,000 Units total) by mouth daily., Disp: 100 capsule, Rfl: 3   Cyanocobalamin  (VITAMIN B 12 PO), Take by mouth., Disp: , Rfl:    GARLIC PO, Take by mouth., Disp: , Rfl:    sertraline  (ZOLOFT ) 100 MG tablet, Take 1.5 tablets (150 mg total) by mouth daily., Disp: 135 tablet, Rfl: 3   simvastatin  (ZOCOR ) 40 MG tablet, Take 1 tablet (40 mg total) by mouth daily., Disp: 90 tablet, Rfl: 3 Medication Side Effects: none  Family Medical/ Social History: Changes? No  MENTAL HEALTH EXAM:  There were no vitals taken for this visit.There is no height or weight on file to calculate BMI.  General Appearance: Casual and Well Groomed  Eye Contact:  Good  Speech:  Clear and Coherent and Normal Rate  Volume:  Normal  Mood:  Euthymic  Affect:  Congruent  Thought Process:  Goal Directed and Descriptions of Associations: Circumstantial  Orientation:  Full (Time, Place, and Person)  Thought Content: Logical   Suicidal Thoughts:  No   Homicidal Thoughts:  No  Memory:  WNL  Judgement:  Good  Insight:  Good  Psychomotor Activity:  Normal  Concentration:  Concentration: Good  Recall:  Good  Fund of Knowledge: Good  Language: Good  Assets:  Communication Skills Desire for Improvement Financial Resources/Insurance Housing Physical Health Resilience Transportation Vocational/Educational  ADL's:  Intact  Cognition: WNL  Prognosis:  Good   DIAGNOSES:    ICD-10-CM   1. Recurrent major depressive disorder, in full remission  F33.42      Receiving Psychotherapy: No   RECOMMENDATIONS:  PDMP reviewed.  No controlled substances. I provided approximately  20 minutes of face to face time during this encounter, including time spent before and after the visit in records review, medical decision making, counseling pertinent to today's visit, and charting.   He is doing well on the current dose of Zoloft .  No changes will be made.  Continue Zoloft  100 mg, 1.5 pills daily. Continue vitamins as per med list. Return in 6 months.  Verneita Cooks, PA-C

## 2025-04-19 ENCOUNTER — Ambulatory Visit: Admitting: Physician Assistant
